# Patient Record
Sex: Male | Born: 1975 | Race: Black or African American | Hispanic: No | Marital: Single | Smoking: Former smoker
Health system: Southern US, Community
[De-identification: ages and names within clinical notes are randomized; demographics above are authoritative.]

## PROBLEM LIST (undated history)

## (undated) DIAGNOSIS — F0781 Postconcussional syndrome: Secondary | ICD-10-CM

## (undated) DIAGNOSIS — J45909 Unspecified asthma, uncomplicated: Secondary | ICD-10-CM

## (undated) DIAGNOSIS — K219 Gastro-esophageal reflux disease without esophagitis: Secondary | ICD-10-CM

## (undated) DIAGNOSIS — E785 Hyperlipidemia, unspecified: Secondary | ICD-10-CM

## (undated) HISTORY — DX: Hyperlipidemia, unspecified: E78.5

## (undated) HISTORY — PX: OTHER SURGICAL HISTORY: SHX169

## (undated) HISTORY — DX: Postconcussional syndrome: F07.81

---

## 1997-08-04 ENCOUNTER — Emergency Department (HOSPITAL_COMMUNITY): Admission: EM | Admit: 1997-08-04 | Discharge: 1997-08-04 | Payer: Self-pay | Admitting: *Deleted

## 1998-06-28 ENCOUNTER — Emergency Department (HOSPITAL_COMMUNITY): Admission: EM | Admit: 1998-06-28 | Discharge: 1998-06-28 | Payer: Self-pay | Admitting: Emergency Medicine

## 1998-08-20 ENCOUNTER — Emergency Department (HOSPITAL_COMMUNITY): Admission: EM | Admit: 1998-08-20 | Discharge: 1998-08-20 | Payer: Self-pay | Admitting: Emergency Medicine

## 1998-08-22 ENCOUNTER — Emergency Department (HOSPITAL_COMMUNITY): Admission: EM | Admit: 1998-08-22 | Discharge: 1998-08-22 | Payer: Self-pay | Admitting: Emergency Medicine

## 1998-08-24 ENCOUNTER — Emergency Department (HOSPITAL_COMMUNITY): Admission: EM | Admit: 1998-08-24 | Discharge: 1998-08-24 | Payer: Self-pay | Admitting: Emergency Medicine

## 1999-03-24 ENCOUNTER — Emergency Department (HOSPITAL_COMMUNITY): Admission: EM | Admit: 1999-03-24 | Discharge: 1999-03-24 | Payer: Self-pay | Admitting: Emergency Medicine

## 1999-03-24 ENCOUNTER — Encounter: Payer: Self-pay | Admitting: Family Medicine

## 2001-08-25 ENCOUNTER — Emergency Department (HOSPITAL_COMMUNITY): Admission: EM | Admit: 2001-08-25 | Discharge: 2001-08-25 | Payer: Self-pay | Admitting: Emergency Medicine

## 2004-09-19 ENCOUNTER — Emergency Department (HOSPITAL_COMMUNITY): Admission: EM | Admit: 2004-09-19 | Discharge: 2004-09-19 | Payer: Self-pay | Admitting: Emergency Medicine

## 2005-06-03 ENCOUNTER — Emergency Department (HOSPITAL_COMMUNITY): Admission: EM | Admit: 2005-06-03 | Discharge: 2005-06-03 | Payer: Self-pay | Admitting: Emergency Medicine

## 2006-06-25 ENCOUNTER — Emergency Department (HOSPITAL_COMMUNITY): Admission: EM | Admit: 2006-06-25 | Discharge: 2006-06-26 | Payer: Self-pay | Admitting: Emergency Medicine

## 2006-06-25 ENCOUNTER — Emergency Department (HOSPITAL_COMMUNITY): Admission: EM | Admit: 2006-06-25 | Discharge: 2006-06-25 | Payer: Self-pay | Admitting: Emergency Medicine

## 2006-07-04 ENCOUNTER — Emergency Department (HOSPITAL_COMMUNITY): Admission: EM | Admit: 2006-07-04 | Discharge: 2006-07-04 | Payer: Self-pay | Admitting: Family Medicine

## 2007-03-06 ENCOUNTER — Emergency Department (HOSPITAL_COMMUNITY): Admission: EM | Admit: 2007-03-06 | Discharge: 2007-03-07 | Payer: Self-pay | Admitting: Emergency Medicine

## 2007-06-21 ENCOUNTER — Emergency Department (HOSPITAL_COMMUNITY): Admission: EM | Admit: 2007-06-21 | Discharge: 2007-06-21 | Payer: Self-pay | Admitting: Emergency Medicine

## 2007-11-01 ENCOUNTER — Emergency Department (HOSPITAL_COMMUNITY): Admission: EM | Admit: 2007-11-01 | Discharge: 2007-11-02 | Payer: Self-pay | Admitting: Emergency Medicine

## 2008-10-19 ENCOUNTER — Emergency Department (HOSPITAL_COMMUNITY): Admission: EM | Admit: 2008-10-19 | Discharge: 2008-10-19 | Payer: Self-pay | Admitting: Family Medicine

## 2010-07-22 LAB — POCT RAPID STREP A (OFFICE): Streptococcus, Group A Screen (Direct): POSITIVE — AB

## 2011-01-07 LAB — URINALYSIS, ROUTINE W REFLEX MICROSCOPIC
Glucose, UA: NEGATIVE
Hgb urine dipstick: NEGATIVE
Ketones, ur: NEGATIVE
Protein, ur: NEGATIVE

## 2011-01-07 LAB — STREP A DNA PROBE

## 2011-01-11 LAB — CBC
Hemoglobin: 14.6
Platelets: 188
RDW: 14.2

## 2011-01-11 LAB — URINE MICROSCOPIC-ADD ON

## 2011-01-11 LAB — RAPID STREP SCREEN (MED CTR MEBANE ONLY): Streptococcus, Group A Screen (Direct): POSITIVE — AB

## 2011-01-11 LAB — POCT I-STAT, CHEM 8
BUN: 10
Chloride: 102
Potassium: 3.5
Sodium: 135

## 2011-01-11 LAB — DIFFERENTIAL
Basophils Absolute: 0
Lymphocytes Relative: 18
Monocytes Absolute: 1.5 — ABNORMAL HIGH
Neutro Abs: 12.6 — ABNORMAL HIGH
Neutrophils Relative %: 73

## 2011-01-11 LAB — URINALYSIS, ROUTINE W REFLEX MICROSCOPIC
Glucose, UA: NEGATIVE
Ketones, ur: NEGATIVE
Leukocytes, UA: NEGATIVE
pH: 8.5 — ABNORMAL HIGH

## 2011-10-22 ENCOUNTER — Emergency Department (HOSPITAL_COMMUNITY)
Admission: EM | Admit: 2011-10-22 | Discharge: 2011-10-22 | Disposition: A | Discharge status: Home / Self Care | Payer: Medicaid Other | Source: Home / Self Care | Attending: Family Medicine | Admitting: Family Medicine

## 2011-10-22 ENCOUNTER — Encounter (HOSPITAL_COMMUNITY): Payer: Self-pay | Admitting: *Deleted

## 2011-10-22 DIAGNOSIS — J020 Streptococcal pharyngitis: Secondary | ICD-10-CM

## 2011-10-22 HISTORY — DX: Unspecified asthma, uncomplicated: J45.909

## 2011-10-22 LAB — POCT RAPID STREP A: Streptococcus, Group A Screen (Direct): POSITIVE — AB

## 2011-10-22 MED ORDER — CEFTRIAXONE SODIUM 1 G IJ SOLR
1.0000 g | Freq: Once | INTRAMUSCULAR | Status: AC
  Administered 2011-10-22: 1 g via INTRAMUSCULAR

## 2011-10-22 MED ORDER — GI COCKTAIL ~~LOC~~
30.0000 mL | Freq: Once | ORAL | Status: AC
  Administered 2011-10-22: 30 mL via ORAL

## 2011-10-22 MED ORDER — GI COCKTAIL ~~LOC~~
ORAL | Status: AC
  Filled 2011-10-22: qty 30

## 2011-10-22 MED ORDER — CEFTRIAXONE SODIUM 1 G IJ SOLR
INTRAMUSCULAR | Status: AC
  Filled 2011-10-22: qty 10

## 2011-10-22 MED ORDER — AMOXICILLIN-POT CLAVULANATE 875-125 MG PO TABS: 1.0000 | ORAL_TABLET | Freq: Two times a day (BID) | ORAL | Status: AC

## 2011-10-22 NOTE — ED Notes (Signed)
Pt is oriented x3 and is speaking full sentences without SOB. Pt states his sore started Saturday and the chest pain started Monday on the upper Lt side chest and it comes and goes every three mins sharp pain, it does not radiate any where. Has never felt this pain before. Pt states he has not lift or had a cough. RN Harriett Sine informed of Pt and I advise Pt if anything changes to inform the front desk.

## 2011-10-22 NOTE — ED Notes (Signed)
Pt  Reports  About  3    Days  Of   sorethroat  Pain on  Swallowing  As  Well  As  l  Sided  Chest  Pain    X  3  Days        - pt  Has   Red    Throat  With  Exudate  Present           Pt  Is  Sitting     Upright on exam table  Speaking    In  Complete        sentances

## 2012-08-25 ENCOUNTER — Emergency Department (HOSPITAL_COMMUNITY): Payer: Medicaid Other

## 2012-08-25 ENCOUNTER — Emergency Department (HOSPITAL_COMMUNITY)
Admission: EM | Admit: 2012-08-25 | Discharge: 2012-08-26 | Disposition: A | Discharge status: Home / Self Care | Payer: Medicaid Other | Attending: Emergency Medicine | Admitting: Emergency Medicine

## 2012-08-25 ENCOUNTER — Encounter (HOSPITAL_COMMUNITY): Payer: Self-pay | Admitting: Emergency Medicine

## 2012-08-25 DIAGNOSIS — J45909 Unspecified asthma, uncomplicated: Secondary | ICD-10-CM | POA: Insufficient documentation

## 2012-08-25 DIAGNOSIS — I493 Ventricular premature depolarization: Secondary | ICD-10-CM

## 2012-08-25 DIAGNOSIS — K219 Gastro-esophageal reflux disease without esophagitis: Secondary | ICD-10-CM | POA: Insufficient documentation

## 2012-08-25 DIAGNOSIS — Z880 Allergy status to penicillin: Secondary | ICD-10-CM | POA: Insufficient documentation

## 2012-08-25 DIAGNOSIS — Z79899 Other long term (current) drug therapy: Secondary | ICD-10-CM | POA: Insufficient documentation

## 2012-08-25 DIAGNOSIS — I4949 Other premature depolarization: Secondary | ICD-10-CM | POA: Insufficient documentation

## 2012-08-25 MED ORDER — GI COCKTAIL ~~LOC~~
30.0000 mL | Freq: Once | ORAL | Status: AC
  Administered 2012-08-26: 30 mL via ORAL
  Filled 2012-08-25: qty 30

## 2012-08-25 MED ORDER — NITROGLYCERIN 0.4 MG SL SUBL
0.4000 mg | SUBLINGUAL_TABLET | SUBLINGUAL | Status: DC | PRN
  Filled 2012-08-25: qty 25

## 2012-08-25 MED ORDER — PANTOPRAZOLE SODIUM 40 MG IV SOLR: 40.0000 mg | Freq: Once | INTRAVENOUS | Status: DC

## 2012-08-25 MED ORDER — ASPIRIN 325 MG PO TABS
325.0000 mg | ORAL_TABLET | ORAL | Status: AC
  Administered 2012-08-26: 325 mg via ORAL
  Filled 2012-08-25: qty 1

## 2012-08-25 NOTE — ED Notes (Signed)
PT. REPORTS INTERMITTENT LEFT CHEST PAIN FOR 4 DAYS , DENIES SOB , NAUSEA OR DIAPHORESIS.

## 2012-08-26 LAB — CBC
HCT: 42.1 % (ref 39.0–52.0)
Hemoglobin: 14.7 g/dL (ref 13.0–17.0)
MCH: 29.3 pg (ref 26.0–34.0)
MCHC: 34.9 g/dL (ref 30.0–36.0)
MCV: 84 fL (ref 78.0–100.0)
RBC: 5.01 MIL/uL (ref 4.22–5.81)

## 2012-08-26 LAB — BASIC METABOLIC PANEL
BUN: 8 mg/dL (ref 6–23)
CO2: 28 mEq/L (ref 19–32)
Calcium: 9.9 mg/dL (ref 8.4–10.5)
Creatinine, Ser: 1.11 mg/dL (ref 0.50–1.35)
GFR calc non Af Amer: 85 mL/min — ABNORMAL LOW (ref >90)
Glucose, Bld: 124 mg/dL — ABNORMAL HIGH (ref 70–99)

## 2012-08-26 LAB — POCT I-STAT TROPONIN I: Troponin i, poc: 0 ng/mL (ref 0.00–0.08)

## 2012-08-26 MED ORDER — SUCRALFATE 1 G PO TABS: 1.0000 g | ORAL_TABLET | Freq: Four times a day (QID) | ORAL | Status: DC

## 2012-08-26 MED ORDER — PANTOPRAZOLE SODIUM 40 MG PO TBEC
40.0000 mg | DELAYED_RELEASE_TABLET | Freq: Once | ORAL | Status: AC
  Administered 2012-08-26: 40 mg via ORAL
  Filled 2012-08-26: qty 1

## 2012-08-26 MED ORDER — PANTOPRAZOLE SODIUM 40 MG PO TBEC: 40.0000 mg | DELAYED_RELEASE_TABLET | Freq: Every day | ORAL | Status: DC

## 2012-08-26 NOTE — ED Provider Notes (Signed)
History     CSN: [id number]  Arrival date & time 08/25/12  2308   First MD Initiated Contact with Patient 08/25/12 2332      Chief Complaint  Patient presents with  . Chest Pain    (Consider location/radiation/quality/duration/timing/severity/associated sxs/prior treatment) HPI 37 year old male presents from home with complaint of left sided chest pain off and on for the last 4 days.  He notices the pain is worse after eating.  He's been taking TUMS which has helped his symptoms until tonight.  Patient denies any medical problems , aside from asthma.  No family history of coronary disease, diabetes or hypertension.  No early cardiac death.  No shortness of breath, no nausea no vomiting.  No abdominal pain.  No fever no cough.  Pain is worse with stretching.  He denies any tenderness to this chest wall.  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  He denies feeling palpitations.  No prior history of same. Past Medical History  Diagnosis Date  . Asthma     History reviewed. No pertinent past surgical history.  No family history on file.  History  Substance Use Topics  . Smoking status: Never Smoker   . Smokeless tobacco: Not on file  . Alcohol Use: Yes      Review of Systems  See History of Present Illness; otherwise all other systems are reviewed and negative  Allergies  Penicillins  Home Medications   Current Outpatient Rx  Name  Route  Sig  Dispense  Refill  . clindamycin (CLEOCIN) 300 MG capsule   Oral   Take 300 mg by mouth 3 (three) times daily.         Marland Kitchen HYDROcodone-acetaminophen (NORCO) 10-325 MG per tablet   Oral   Take 1 tablet by mouth every 6 (six) hours as needed.           BP 143/53  Pulse 45  Temp(Src) 98.3 F (36.8 C) (Oral)  Resp 16  SpO2 100%  Physical Exam  Nursing note and vitals reviewed. Constitutional: He is oriented to person, place, and time. He appears well-developed and well-nourished.  HENT:  Head: Normocephalic and atraumatic.   Nose: Nose normal.  Mouth/Throat: Oropharynx is clear and moist.  Eyes: Conjunctivae and EOM are normal. Pupils are equal, round, and reactive to light.  Neck: Normal range of motion. Neck supple. No JVD present. No tracheal deviation present. No thyromegaly present.  Cardiovascular: Normal rate, normal heart sounds and intact distal pulses.  Exam reveals no gallop and no friction rub.   No murmur heard. Frequent extra systolic beats auscultated  Pulmonary/Chest: Effort normal and breath sounds normal. No stridor. No respiratory distress. He has no wheezes. He has no rales. He exhibits no tenderness.  Abdominal: Soft. Bowel sounds are normal. He exhibits no distension and no mass. There is no tenderness. There is no rebound and no guarding.  Musculoskeletal: Normal range of motion. He exhibits no edema and no tenderness.  Lymphadenopathy:    He has no cervical adenopathy.  Neurological: He is alert and oriented to person, place, and time. He exhibits normal muscle tone. Coordination normal.  Skin: Skin is warm and dry. No rash noted. No erythema. No pallor.  Psychiatric: He has a normal mood and affect. His behavior is normal. Judgment and thought content normal.    ED Course  Procedures (including critical care time)  Labs Reviewed  BASIC METABOLIC PANEL - Abnormal; Notable for the following:    Glucose, Bld 124 (*)  GFR calc non Af Amer 85 (*)    All other components within normal limits  CBC  MAGNESIUM  POCT I-STAT TROPONIN I   Dg Chest 2 View  08/25/2012  *RADIOLOGY REPORT*  Clinical Data: Chest pain.  CHEST - 2 VIEW  Comparison: 11/01/2007  Findings: Heart and mediastinal contours are within normal limits. No focal opacities or effusions.  No acute bony abnormality.  IMPRESSION: No active cardiopulmonary disease.   Original Report Authenticated By: Charlett Nose, M.D.     Date: 08/25/2012  Rate: 85  Rhythm: normal sinus rhythm and premature ventricular contractions (PVC)   QRS Axis: normal  Intervals: normal  ST/T Wave abnormalities: normal  Conduction Disutrbances:none  Narrative Interpretation: bigeminy  Old EKG Reviewed: none available    1. GERD (gastroesophageal reflux disease)   2. Frequent PVCs       MDM  37 year old male, appears nontoxic.  Symptoms seem consistent with GERD, esophageal spasm.  Labs are pending.  EKG shows frequent PVCs in a pattern of bigeminy.  May be due to a loculated abnormality.  Will treat with GI cocktail and Protonix and continue to monitor.       Olivia Mackie, MD 08/26/12 [phone]

## 2014-08-30 ENCOUNTER — Emergency Department (HOSPITAL_COMMUNITY)
Admission: EM | Admit: 2014-08-30 | Discharge: 2014-08-30 | Disposition: A | Discharge status: Home / Self Care | Payer: Medicaid Other | Source: Home / Self Care | Attending: Family Medicine | Admitting: Family Medicine

## 2014-08-30 ENCOUNTER — Encounter (HOSPITAL_COMMUNITY): Payer: Self-pay | Admitting: Emergency Medicine

## 2014-08-30 DIAGNOSIS — B349 Viral infection, unspecified: Secondary | ICD-10-CM

## 2014-08-30 LAB — POCT RAPID STREP A: Streptococcus, Group A Screen (Direct): NEGATIVE

## 2014-08-30 MED ORDER — IBUPROFEN 800 MG PO TABS
800.0000 mg | ORAL_TABLET | Freq: Once | ORAL | Status: AC
  Administered 2014-08-30: 800 mg via ORAL

## 2014-08-30 MED ORDER — ALBUTEROL SULFATE HFA 108 (90 BASE) MCG/ACT IN AERS: 1.0000 | INHALATION_SPRAY | Freq: Four times a day (QID) | RESPIRATORY_TRACT | Status: DC | PRN

## 2014-08-30 MED ORDER — IBUPROFEN 800 MG PO TABS
ORAL_TABLET | ORAL | Status: AC
  Filled 2014-08-30: qty 1

## 2014-08-30 NOTE — Discharge Instructions (Signed)
i suspect you are suffering from the same viral illness that your family members are experiencing. Your exam and vital signs are very reassuring. Your rapid strep test is negative. Please use inhaler as prescribed for wheezing or persistent cough. Plenty of fluids, tylenol and rest as you need to. If symptoms become suddenly worse or severe, please seek re-evaluation at your nearest emergency room. If symptoms do not improve over the next several days, please follow up with your doctor at the family practice center.  Cough, Adult  A cough is a reflex that helps clear your throat and airways. It can help heal the body or may be a reaction to an irritated airway. A cough may only last 2 or 3 weeks (acute) or may last more than 8 weeks (chronic).  CAUSES Acute cough:  Viral or bacterial infections. Chronic cough:  Infections.  Allergies.  Asthma.  Post-nasal drip.  Smoking.  Heartburn or acid reflux.  Some medicines.  Chronic lung problems (COPD).  Cancer. SYMPTOMS   Cough.  Fever.  Chest pain.  Increased breathing rate.  High-pitched whistling sound when breathing (wheezing).  Colored mucus that you cough up (sputum). TREATMENT   A bacterial cough may be treated with antibiotic medicine.  A viral cough must run its course and will not respond to antibiotics.  Your caregiver may recommend other treatments if you have a chronic cough. HOME CARE INSTRUCTIONS   Only take over-the-counter or prescription medicines for pain, discomfort, or fever as directed by your caregiver. Use cough suppressants only as directed by your caregiver.  Use a cold steam vaporizer or humidifier in your bedroom or home to help loosen secretions.  Sleep in a semi-upright position if your cough is worse at night.  Rest as needed.  Stop smoking if you smoke. SEEK IMMEDIATE MEDICAL CARE IF:   You have pus in your sputum.  Your cough starts to worsen.  You cannot control your cough with  suppressants and are losing sleep.  You begin coughing up blood.  You have difficulty breathing.  You develop pain which is getting worse or is uncontrolled with medicine.  You have a fever. MAKE SURE YOU:   Understand these instructions.  Will watch your condition.  Will get help right away if you are not doing well or get worse. Document Released: 09/28/2010 Document Revised: 06/24/2011 Document Reviewed: 09/28/2010 [facility] Patient Information 2015 [employer], [place]. This information is not intended to replace advice given to you by your health care provider. Make sure you discuss any questions you have with your health care provider.  Viral Infections A viral infection can be caused by different types of viruses.Most viral infections are not serious and resolve on their own. However, some infections may cause severe symptoms and may lead to further complications. SYMPTOMS Viruses can frequently cause:  Minor sore throat.  Aches and pains.  Headaches.  Runny nose.  Different types of rashes.  Watery eyes.  Tiredness.  Cough.  Loss of appetite.  Gastrointestinal infections, resulting in nausea, vomiting, and diarrhea. These symptoms do not respond to antibiotics because the infection is not caused by bacteria. However, you might catch a bacterial infection following the viral infection. This is sometimes called a "superinfection." Symptoms of such a bacterial infection may include:  Worsening sore throat with pus and difficulty swallowing.  Swollen neck glands.  Chills and a high or persistent fever.  Severe headache.  Tenderness over the sinuses.  Persistent overall ill feeling (malaise), muscle aches, and  tiredness (fatigue).  Persistent cough.  Yellow, green, or brown mucus production with coughing. HOME CARE INSTRUCTIONS   Only take over-the-counter or prescription medicines for pain, discomfort, diarrhea, or fever as directed by your  caregiver.  Drink enough water and fluids to keep your urine clear or pale yellow. Sports drinks can provide valuable electrolytes, sugars, and hydration.  Get plenty of rest and maintain proper nutrition. Soups and broths with crackers or rice are fine. SEEK IMMEDIATE MEDICAL CARE IF:   You have severe headaches, shortness of breath, chest pain, neck pain, or an unusual rash.  You have uncontrolled vomiting, diarrhea, or you are unable to keep down fluids.  You or your child has an oral temperature above 102 F (38.9 C), not controlled by medicine.  Your baby is older than 3 months with a rectal temperature of 102 F (38.9 C) or higher.  Your baby is 43 months old or younger with a rectal temperature of 100.4 F (38 C) or higher. MAKE SURE YOU:   Understand these instructions.  Will watch your condition.  Will get help right away if you are not doing well or get worse. Document Released: 01/09/2005 Document Revised: 06/24/2011 Document Reviewed: 08/06/2010 [facility]Exit[facility] Patient Information 2015 [employer], [place]. This information is not intended to replace advice given to you by your health care provider. Make sure you discuss any questions you have with your health care provider.

## 2014-08-30 NOTE — ED Provider Notes (Signed)
CSN: [id number]     Arrival date & time 08/30/14  [id number] History   First MD Initiated Contact with Tom Miranda 08/30/14 [phone]     Chief Complaint  Tom Miranda presents with  . URI  . Abdominal Pain   (Consider location/radiation/quality/duration/timing/severity/associated sxs/prior Treatment) HPI Comments: PCP: [facility] Nonsmoker Owns tattoo shop Also mentions an upset stomach and that he is passing quite a bit of gas.   Tom Miranda is a 39 y.o. male presenting with URI and abdominal pain. The history is provided by the Tom Miranda.  URI Presenting symptoms: congestion, cough, rhinorrhea and sore throat   Presenting symptoms: no ear pain, no facial pain, no fatigue and no fever   Severity:  Mild Onset quality:  Gradual Duration: 12-18 hours. Timing:  Constant Progression:  Unchanged Chronicity:  New Risk factors: sick contacts   Risk factors comment:  Tom RainwaterFiancee and Tom Miranda ill with same Abdominal Pain Associated symptoms: cough, shortness of breath and sore throat   Associated symptoms: no constipation, no diarrhea, no fatigue, no fever, no nausea and no vomiting     Past Medical History  Diagnosis Date  . Asthma    History reviewed. No pertinent past surgical history. No family history on file. History  Substance Use Topics  . Smoking status: Never Smoker   . Smokeless tobacco: Not on file  . Alcohol Use: Yes    Review of Systems  Constitutional: Negative for fever and fatigue.  HENT: Positive for congestion, rhinorrhea and sore throat. Negative for ear pain.   Eyes: Negative.   Respiratory: Positive for cough and shortness of breath.   Cardiovascular: Negative.   Gastrointestinal: Positive for abdominal pain. Negative for nausea, vomiting, diarrhea and constipation.  Genitourinary: Negative.   Musculoskeletal: Negative.   Skin: Negative.     Allergies  Penicillins  Home Medications   Prior to Admission medications   Medication Sig Start Date End Date Taking? Authorizing Provider   albuterol (PROVENTIL HFA;VENTOLIN HFA) 108 (90 BASE) MCG/ACT inhaler Inhale 1-2 puffs into the lungs every 6 (six) hours as needed for wheezing or shortness of breath. Or persistent cough 08/30/14   Ria ClockJennifer Lee H [place] [employer], PA  calcium carbonate (TUMS - DOSED IN MG ELEMENTAL CALCIUM) 500 MG chewable tablet Chew 1 tablet by mouth daily.    Historical Provider, MD  pantoprazole (PROTONIX) 40 MG tablet Take 1 tablet (40 mg total) by mouth daily. 08/26/12   Marisa Severinlga Otter, MD  sucralfate (CARAFATE) 1 G tablet Take 1 tablet (1 g total) by mouth 4 (four) times daily. 08/26/12   Marisa Severinlga Otter, MD   BP 118/70 mmHg  Pulse 44  Temp(Src) 98.3 F (36.8 C) (Oral)  Resp 16  SpO2 98% Physical Exam  Constitutional: He is oriented to person, place, and time. He appears well-developed and well-nourished. No distress.  HENT:  Head: Normocephalic and atraumatic.  Right Ear: Hearing, tympanic membrane, external ear and ear canal normal.  Left Ear: Hearing, tympanic membrane, external ear and ear canal normal.  Nose: Nose normal.  Mouth/Throat: Uvula is midline, oropharynx is clear and moist and mucous membranes are normal.  Eyes: Conjunctivae are normal.  Neck: Normal range of motion. Neck supple.  Cardiovascular: Regular rhythm, normal heart sounds and normal pulses.  Bradycardia present.   Pulmonary/Chest: Effort normal and breath sounds normal. No respiratory distress. He has no decreased breath sounds. He has no wheezes. He has no rales. He exhibits no tenderness.  Speaks in full, clear sentences. Does not appear to have any increased work  of breathing  Abdominal: Soft. Bowel sounds are normal. He exhibits no distension and no mass. There is no tenderness. There is no rebound and no guarding.  Musculoskeletal: Normal range of motion.  Lymphadenopathy:    He has no cervical adenopathy.  Neurological: He is alert and oriented to person, place, and time.  Skin: Skin is warm and dry.  Psychiatric: He has a normal  mood and affect. His behavior is normal.  Nursing note and vitals reviewed.   ED Course  Procedures (including critical care time) Labs Review Labs Reviewed  POCT RAPID STREP A ([facility] CARE ONLY)    Imaging Review No results found.   MDM   1. Viral syndrome    Rapid strep is negative I suspect Tom Miranda is dealing with same viral syndrome that his family members are experiencing.  Exam is reassuring and vitals are also without concern. Tom Miranda is a very physically fit individual which is likely the reason for his resting bradycardia.  Will treat with Albuterol MDI should he feel as if he is wheezing, salt water gargles, tylenol, fluids and rest. Advised to follow up with his PCP if symptoms worse or do not improve.     Ria ClockJennifer Lee H Aastha Dayley, [place]PA 08/30/14 1028

## 2014-08-30 NOTE — ED Notes (Signed)
C/o prod cough, ST, and abd pain onset yest night Denies fevers, chills, diarrhea Alert, no signs of acute

## 2014-09-03 LAB — CULTURE, GROUP A STREP

## 2015-01-09 ENCOUNTER — Emergency Department (HOSPITAL_COMMUNITY)
Admission: EM | Admit: 2015-01-09 | Discharge: 2015-01-09 | Disposition: A | Discharge status: Home / Self Care | Payer: Medicaid Other | Attending: Emergency Medicine | Admitting: Emergency Medicine

## 2015-01-09 ENCOUNTER — Encounter (HOSPITAL_COMMUNITY): Payer: Self-pay | Admitting: Emergency Medicine

## 2015-01-09 ENCOUNTER — Emergency Department (HOSPITAL_COMMUNITY): Payer: Medicaid Other

## 2015-01-09 DIAGNOSIS — R079 Chest pain, unspecified: Secondary | ICD-10-CM

## 2015-01-09 DIAGNOSIS — J45909 Unspecified asthma, uncomplicated: Secondary | ICD-10-CM | POA: Insufficient documentation

## 2015-01-09 DIAGNOSIS — Z880 Allergy status to penicillin: Secondary | ICD-10-CM | POA: Diagnosis not present

## 2015-01-09 DIAGNOSIS — Z8719 Personal history of other diseases of the digestive system: Secondary | ICD-10-CM | POA: Diagnosis not present

## 2015-01-09 HISTORY — DX: Gastro-esophageal reflux disease without esophagitis: K21.9

## 2015-01-09 LAB — I-STAT TROPONIN, ED
TROPONIN I, POC: 0.01 ng/mL (ref 0.00–0.08)
Troponin i, poc: 0 ng/mL (ref 0.00–0.08)

## 2015-01-09 LAB — BASIC METABOLIC PANEL
ANION GAP: 8 (ref 5–15)
BUN: 9 mg/dL (ref 6–20)
CO2: 25 mmol/L (ref 22–32)
Calcium: 9.6 mg/dL (ref 8.9–10.3)
Chloride: 105 mmol/L (ref 101–111)
Creatinine, Ser: 1.12 mg/dL (ref 0.61–1.24)
GFR calc Af Amer: >60 mL/min (ref >60)
Glucose, Bld: 110 mg/dL — ABNORMAL HIGH (ref 65–99)
POTASSIUM: 4.2 mmol/L (ref 3.5–5.1)
SODIUM: 138 mmol/L (ref 135–145)

## 2015-01-09 LAB — CBC
HEMATOCRIT: 44.2 % (ref 39.0–52.0)
Hemoglobin: 15 g/dL (ref 13.0–17.0)
MCH: 29.2 pg (ref 26.0–34.0)
MCHC: 33.9 g/dL (ref 30.0–36.0)
MCV: 86 fL (ref 78.0–100.0)
PLATELETS: 205 10*3/uL (ref 150–400)
RBC: 5.14 MIL/uL (ref 4.22–5.81)
RDW: 13.6 % (ref 11.5–15.5)
WBC: 5.4 10*3/uL (ref 4.0–10.5)

## 2015-01-09 LAB — MAGNESIUM: MAGNESIUM: 1.8 mg/dL (ref 1.7–2.4)

## 2015-01-09 MED ORDER — PANTOPRAZOLE SODIUM 40 MG PO TBEC
40.0000 mg | DELAYED_RELEASE_TABLET | Freq: Once | ORAL | Status: AC
  Administered 2015-01-09: 40 mg via ORAL
  Filled 2015-01-09: qty 1

## 2015-01-09 MED ORDER — ASPIRIN 81 MG PO CHEW
324.0000 mg | CHEWABLE_TABLET | Freq: Once | ORAL | Status: AC
  Administered 2015-01-09: 324 mg via ORAL
  Filled 2015-01-09: qty 4

## 2015-01-09 NOTE — Discharge Instructions (Signed)
Your caregiver has diagnosed you as having chest pain that is not specific for one problem, but does not require admission.  You are at low risk for an acute heart condition or other serious illness. Chest pain comes from many different causes.  SEEK IMMEDIATE MEDICAL ATTENTION IF: You have severe chest pain, especially if the pain is crushing or pressure-like and spreads to the arms, back, neck, or jaw, or if you have sweating, nausea (feeling sick to your stomach), or shortness of breath. THIS IS AN EMERGENCY. Don't wait to see if the pain will go away. Get medical help at once. Call 911 or 0 (operator). DO NOT drive yourself to the hospital.  Your chest pain gets worse and does not go away with rest.  You have an attack of chest pain lasting longer than usual, despite rest and treatment with the medications your caregiver has prescribed.  You wake from sleep with chest pain or shortness of breath.  You feel dizzy or faint.  You have chest pain not typical of your usual pain for which you originally saw your caregiver.   Chest Pain (Nonspecific) It is often hard to give a specific diagnosis for the cause of chest pain. There is always a chance that your pain could be related to something serious, such as a heart attack or a blood clot in the lungs. You need to follow up with your health care provider for further evaluation. CAUSES   Heartburn.  Pneumonia or bronchitis.  Anxiety or stress.  Inflammation around your heart (pericarditis) or lung (pleuritis or pleurisy).  A blood clot in the lung.  A collapsed lung (pneumothorax). It can develop suddenly on its own (spontaneous pneumothorax) or from trauma to the chest.  Shingles infection (herpes zoster virus). The chest wall is composed of bones, muscles, and cartilage. Any of these can be the source of the pain.  The bones can be bruised by injury.  The muscles or cartilage can be strained by coughing or overwork.  The cartilage can  be affected by inflammation and become sore (costochondritis). DIAGNOSIS  Lab tests or other studies may be needed to find the cause of your pain. Your health care provider may have you take a test called an ambulatory electrocardiogram (ECG). An ECG records your heartbeat patterns over a 24-hour period. You may also have other tests, such as:  Transthoracic echocardiogram (TTE). During echocardiography, sound waves are used to evaluate how blood flows through your heart.  Transesophageal echocardiogram (TEE).  Cardiac monitoring. This allows your health care provider to monitor your heart rate and rhythm in real time.  Holter monitor. This is a portable device that records your heartbeat and can help diagnose heart arrhythmias. It allows your health care provider to track your heart activity for several days, if needed.  Stress tests by exercise or by giving medicine that makes the heart beat faster. TREATMENT   Treatment depends on what may be causing your chest pain. Treatment may include:  Acid blockers for heartburn.  Anti-inflammatory medicine.  Pain medicine for inflammatory conditions.  Antibiotics if an infection is present.  You may be advised to change lifestyle habits. This includes stopping smoking and avoiding alcohol, caffeine, and chocolate.  You may be advised to keep your head raised (elevated) when sleeping. This reduces the chance of acid going backward from your stomach into your esophagus. Most of the time, nonspecific chest pain will improve within 2-3 days with rest and mild pain medicine.  HOME  CARE INSTRUCTIONS   If antibiotics were prescribed, take them as directed. Finish them even if you start to feel better.  For the next few days, avoid physical activities that bring on chest pain. Continue physical activities as directed.  Do not use any tobacco products, including cigarettes, chewing tobacco, or electronic cigarettes.  Avoid drinking  alcohol.  Only take medicine as directed by your health care provider.  Follow your health care provider's suggestions for further testing if your chest pain does not go away.  Keep any follow-up appointments you made. If you do not go to an appointment, you could develop lasting (chronic) problems with pain. If there is any problem keeping an appointment, call to reschedule. SEEK MEDICAL CARE IF:   Your chest pain does not go away, even after treatment.  You have a rash with blisters on your chest.  You have a fever. SEEK IMMEDIATE MEDICAL CARE IF:   You have increased chest pain or pain that spreads to your arm, neck, jaw, back, or abdomen.  You have shortness of breath.  You have an increasing cough, or you cough up blood.  You have severe back or abdominal pain.  You feel nauseous or vomit.  You have severe weakness.  You faint.  You have chills. This is an emergency. Do not wait to see if the pain will go away. Get medical help at once. Call your local emergency services (911 in [place] Do not drive yourself to the hospital. MAKE SURE YOU:   Understand these instructions.  Will watch your condition.  Will get help right away if you are not doing well or get worse. Document Released: 01/09/2005 Document Revised: 04/06/2013 Document Reviewed: 11/05/2007 [facility] Patient Information 2015 [place], [place]. This information is not intended to replace advice given to you by your health care provider. Make sure you discuss any questions you have with your health care provider.  Chest Pain (Nonspecific) It is often hard to give a specific diagnosis for the cause of chest pain. There is always a chance that your pain could be related to something serious, such as a heart attack or a blood clot in the lungs. You need to follow up with your health care provider for further evaluation. CAUSES   Heartburn.  Pneumonia or bronchitis.  Anxiety or stress.  Inflammation around  your heart (pericarditis) or lung (pleuritis or pleurisy).  A blood clot in the lung.  A collapsed lung (pneumothorax). It can develop suddenly on its own (spontaneous pneumothorax) or from trauma to the chest.  Shingles infection (herpes zoster virus). The chest wall is composed of bones, muscles, and cartilage. Any of these can be the source of the pain.  The bones can be bruised by injury.  The muscles or cartilage can be strained by coughing or overwork.  The cartilage can be affected by inflammation and become sore (costochondritis). DIAGNOSIS  Lab tests or other studies may be needed to find the cause of your pain. Your health care provider may have you take a test called an ambulatory electrocardiogram (ECG). An ECG records your heartbeat patterns over a 24-hour period. You may also have other tests, such as:  Transthoracic echocardiogram (TTE). During echocardiography, sound waves are used to evaluate how blood flows through your heart.  Transesophageal echocardiogram (TEE).  Cardiac monitoring. This allows your health care provider to monitor your heart rate and rhythm in real time.  Holter monitor. This is a portable device that records your heartbeat and can help  diagnose heart arrhythmias. It allows your health care provider to track your heart activity for several days, if needed.  Stress tests by exercise or by giving medicine that makes the heart beat faster. TREATMENT   Treatment depends on what may be causing your chest pain. Treatment may include:  Acid blockers for heartburn.  Anti-inflammatory medicine.  Pain medicine for inflammatory conditions.  Antibiotics if an infection is present.  You may be advised to change lifestyle habits. This includes stopping smoking and avoiding alcohol, caffeine, and chocolate.  You may be advised to keep your head raised (elevated) when sleeping. This reduces the chance of acid going backward from your stomach into your  esophagus. Most of the time, nonspecific chest pain will improve within 2-3 days with rest and mild pain medicine.  HOME CARE INSTRUCTIONS   If antibiotics were prescribed, take them as directed. Finish them even if you start to feel better.  For the next few days, avoid physical activities that bring on chest pain. Continue physical activities as directed.  Do not use any tobacco products, including cigarettes, chewing tobacco, or electronic cigarettes.  Avoid drinking alcohol.  Only take medicine as directed by your health care provider.  Follow your health care provider's suggestions for further testing if your chest pain does not go away.  Keep any follow-up appointments you made. If you do not go to an appointment, you could develop lasting (chronic) problems with pain. If there is any problem keeping an appointment, call to reschedule. SEEK MEDICAL CARE IF:   Your chest pain does not go away, even after treatment.  You have a rash with blisters on your chest.  You have a fever. SEEK IMMEDIATE MEDICAL CARE IF:   You have increased chest pain or pain that spreads to your arm, neck, jaw, back, or abdomen.  You have shortness of breath.  You have an increasing cough, or you cough up blood.  You have severe back or abdominal pain.  You feel nauseous or vomit.  You have severe weakness.  You faint.  You have chills. This is an emergency. Do not wait to see if the pain will go away. Get medical help at once. Call your local emergency services (911 in [place] Do not drive yourself to the hospital. MAKE SURE YOU:   Understand these instructions.  Will watch your condition.  Will get help right away if you are not doing well or get worse. Document Released: 01/09/2005 Document Revised: 04/06/2013 Document Reviewed: 11/05/2007 [facility] Patient Information 2015 [place], [place]. This information is not intended to replace advice given to you by your health care provider.  Make sure you discuss any questions you have with your health care provider.

## 2015-01-09 NOTE — ED Notes (Signed)
Patient transported to X-ray 

## 2015-01-09 NOTE — ED Provider Notes (Signed)
CSN: [id number]     Arrival date & time 01/09/15  0701 History   First MD Initiated Contact with Patient 01/09/15 [phone]     Chief Complaint  Patient presents with  . Chest Pain     (Consider location/radiation/quality/duration/timing/severity/associated sxs/prior Treatment) HPI  XZANDER GILHAM Is a 39 year old male who presents emergency Department with chief complaint of chest pain. Patient states that he awoke from sleep at 5 AM this morning with pain in his left chest radiating to his left arm. He describes it as aching and crampy. He denies any shortness of breath, nausea, cold sweats. The patient does have a history of acid reflux. He denies history of smoking, family history of MI, hypertension or hyperlipidemia. He states that his initial pain was a 6 out of 10 and is currently a 4 out of 10. He denies any recent heavy lifting. Patient states that he does drink liquor on the weekends when he goes out, but denies daily use. Past Medical History  Diagnosis Date  . Asthma   . GERD (gastroesophageal reflux disease)    History reviewed. No pertinent past surgical history. No family history on file. Social History  Substance Use Topics  . Smoking status: Never Smoker   . Smokeless tobacco: None  . Alcohol Use: Yes    Review of Systems  Ten systems reviewed and are negative for acute change, except as noted in the HPI.    Allergies  Penicillins  Home Medications   Prior to Admission medications   Not on File   BP 115/65 mmHg  Pulse 69  Temp(Src) 98 F (36.7 C)  Resp 14  Ht  (1.702 m)  Wt 214 lb (97.07 kg)  BMI 33.51 kg/m2  SpO2 100% Physical Exam  Constitutional: He appears well-developed and well-nourished. No distress.  HENT:  Head: Normocephalic and atraumatic.  Eyes: Conjunctivae are normal. No scleral icterus.  Neck: Normal range of motion. Neck supple.  Cardiovascular: Normal rate, regular rhythm and normal heart sounds.   Pulmonary/Chest: Effort  normal and breath sounds normal. No respiratory distress. He exhibits no tenderness.  Abdominal: Soft. There is no tenderness.  Musculoskeletal: He exhibits no edema.  Neurological: He is alert.  Skin: Skin is warm and dry. He is not diaphoretic.  Psychiatric: His behavior is normal.  Nursing note and vitals reviewed.   ED Course  Procedures (including critical care time) Labs Review Labs Reviewed  BASIC METABOLIC PANEL - Abnormal; Notable for the following:    Glucose, Bld 110 (*)    All other components within normal limits  CBC  MAGNESIUM  I-STAT TROPOININ, ED  Rosezena Sensor, ED    Imaging Review Dg Chest 2 View  01/09/2015   CLINICAL DATA:  Left chest pain  EXAM: CHEST  2 VIEW  COMPARISON:  08/25/2012  FINDINGS: The heart size and mediastinal contours are within normal limits. Both lungs are clear. The visualized skeletal structures are unremarkable.  IMPRESSION: No active cardiopulmonary disease.   Electronically Signed   By: Judie Petit.  Shick M.D.   On: 01/09/2015 07:53   I have personally reviewed and evaluated these images and lab results as part of my medical decision-making.   EKG Interpretation   Date/Time:  Monday January 09 2015 07:07:44 EDT Ventricular Rate:  74 PR Interval:  160 QRS Duration: 78 QT Interval:  420 QTC Calculation: 466 R Axis:   32 Text Interpretation:  Sinus rhythm with frequent Premature ventricular  complexes in a pattern of bigeminy  Nonspecific T wave abnormality  Prolonged QT Abnormal ECG Confirmed by POLLINA  MD, CHRISTOPHER [phone]) on  01/09/2015 8:38:08 AM      MDM   Final diagnoses:  Chest pain, unspecified chest pain type    8:15 PM Patient here with complaint of chest pain. His EKG is changed from previous, with PVCs in the a pattern of bigeminy, nonspecific T-wave abnormalities appear unchanged. Patient does have prolonged QT. Initial troponin negative. Magnesium is within normal limits. CBC and BMP are unchanged. Plan to  recheck a troponin.  8:15 PM Patient pain is completely resolved.  Awaiting delta troponin.  Patient with repeat negative troponin. HEART score of 1, low risk for MACE,. D/c with pcp/cardiology follow.   Arthor Captain, PA-C 01/09/15 2018  Gilda Crease, MD 01/10/15 2051

## 2015-01-09 NOTE — ED Notes (Signed)
Pt c/o left sided chest pain onset at 0500 today that woke him up out of his sleep. Pt has history of acid reflux but only takes medication when needed. Pt reports that he ate pizza 2 days in a row.

## 2016-04-17 ENCOUNTER — Other Ambulatory Visit: Payer: Self-pay | Admitting: Emergency Medicine

## 2016-04-17 ENCOUNTER — Ambulatory Visit (INDEPENDENT_AMBULATORY_CARE_PROVIDER_SITE_OTHER): Payer: Self-pay

## 2016-04-17 DIAGNOSIS — R52 Pain, unspecified: Secondary | ICD-10-CM

## 2016-04-17 DIAGNOSIS — M542 Cervicalgia: Secondary | ICD-10-CM

## 2016-04-18 ENCOUNTER — Emergency Department (HOSPITAL_COMMUNITY)
Admission: EM | Admit: 2016-04-18 | Discharge: 2016-04-18 | Disposition: A | Discharge status: Home / Self Care | Payer: Medicaid Other | Attending: Emergency Medicine | Admitting: Emergency Medicine

## 2016-04-18 ENCOUNTER — Encounter (HOSPITAL_COMMUNITY): Payer: Self-pay | Admitting: Nurse Practitioner

## 2016-04-18 DIAGNOSIS — S39012A Strain of muscle, fascia and tendon of lower back, initial encounter: Secondary | ICD-10-CM | POA: Insufficient documentation

## 2016-04-18 DIAGNOSIS — Y939 Activity, unspecified: Secondary | ICD-10-CM | POA: Insufficient documentation

## 2016-04-18 DIAGNOSIS — J45909 Unspecified asthma, uncomplicated: Secondary | ICD-10-CM | POA: Insufficient documentation

## 2016-04-18 DIAGNOSIS — Z79899 Other long term (current) drug therapy: Secondary | ICD-10-CM | POA: Insufficient documentation

## 2016-04-18 DIAGNOSIS — Y929 Unspecified place or not applicable: Secondary | ICD-10-CM | POA: Insufficient documentation

## 2016-04-18 DIAGNOSIS — Y990 Civilian activity done for income or pay: Secondary | ICD-10-CM | POA: Insufficient documentation

## 2016-04-18 DIAGNOSIS — S161XXA Strain of muscle, fascia and tendon at neck level, initial encounter: Secondary | ICD-10-CM

## 2016-04-18 MED ORDER — NAPROXEN 500 MG PO TABS: 500.0000 mg | ORAL_TABLET | Freq: Two times a day (BID) | ORAL | 0 refills | Status: DC

## 2016-04-18 MED ORDER — CYCLOBENZAPRINE HCL 10 MG PO TABS: 10.0000 mg | ORAL_TABLET | Freq: Two times a day (BID) | ORAL | 0 refills | Status: DC | PRN
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
## 2016-04-18 NOTE — ED Notes (Signed)
EDP at bedside  

## 2016-04-18 NOTE — Discharge Instructions (Signed)
SEEK IMMEDIATE MEDICAL ATTENTION IF: New numbness, tingling, weakness, or problem with the use of your arms or legs.  Severe back pain not relieved with medications.  Change in bowel or bladder control.  Increasing pain in any areas of the body (such as chest or abdominal pain).  Shortness of breath, dizziness or fainting.  Nausea (feeling sick to your stomach), vomiting, fever, or sweats.  

## 2016-04-19 ENCOUNTER — Ambulatory Visit (INDEPENDENT_AMBULATORY_CARE_PROVIDER_SITE_OTHER): Payer: Worker's Compensation

## 2016-04-19 ENCOUNTER — Other Ambulatory Visit: Payer: Self-pay | Admitting: Emergency Medicine

## 2016-04-19 DIAGNOSIS — T1490XA Injury, unspecified, initial encounter: Secondary | ICD-10-CM

## 2016-04-19 DIAGNOSIS — M542 Cervicalgia: Secondary | ICD-10-CM | POA: Diagnosis not present

## 2016-04-19 DIAGNOSIS — R531 Weakness: Secondary | ICD-10-CM | POA: Diagnosis not present

## 2016-04-24 ENCOUNTER — Ambulatory Visit (INDEPENDENT_AMBULATORY_CARE_PROVIDER_SITE_OTHER): Payer: Self-pay

## 2016-04-24 ENCOUNTER — Other Ambulatory Visit: Payer: Self-pay | Admitting: Emergency Medicine

## 2016-04-24 DIAGNOSIS — M546 Pain in thoracic spine: Secondary | ICD-10-CM

## 2016-04-24 DIAGNOSIS — M545 Low back pain: Secondary | ICD-10-CM

## 2016-07-03 ENCOUNTER — Encounter (HOSPITAL_COMMUNITY): Payer: Self-pay | Admitting: *Deleted

## 2016-07-03 ENCOUNTER — Ambulatory Visit (HOSPITAL_COMMUNITY)
Admission: EM | Admit: 2016-07-03 | Discharge: 2016-07-03 | Disposition: A | Discharge status: Home / Self Care | Payer: Medicaid Other | Attending: Emergency Medicine | Admitting: Emergency Medicine

## 2016-07-03 DIAGNOSIS — J029 Acute pharyngitis, unspecified: Secondary | ICD-10-CM | POA: Insufficient documentation

## 2016-07-03 DIAGNOSIS — Z880 Allergy status to penicillin: Secondary | ICD-10-CM | POA: Insufficient documentation

## 2016-07-03 DIAGNOSIS — R6889 Other general symptoms and signs: Secondary | ICD-10-CM

## 2016-07-03 DIAGNOSIS — R05 Cough: Secondary | ICD-10-CM | POA: Insufficient documentation

## 2016-07-03 LAB — POCT RAPID STREP A: Streptococcus, Group A Screen (Direct): NEGATIVE

## 2016-07-03 MED ORDER — OSELTAMIVIR PHOSPHATE 75 MG PO CAPS: 75.0000 mg | ORAL_CAPSULE | Freq: Two times a day (BID) | ORAL | 0 refills | Status: DC

## 2016-07-03 NOTE — ED Triage Notes (Signed)
Pt  Reports   sorethroat   Body  Aches   And  Fever         Pt  Took  Some  Tylenol  prior  To  Arriving  ucc    Alert  And oriented

## 2016-07-03 NOTE — Discharge Instructions (Signed)
You most likely have a viral URI such as influenza, or an influenza-like illness.  For treatment of influenza, I have prescribed Tamiflu. Take 1 tablet twice a day for 5 days. , I advise rest, plenty of fluids and management of symptoms with over the counter medicines. For symptoms you may take Tylenol as needed every 4-6 hours for body aches or fever, not to exceed 4,000 mg a day, Take mucinex or mucinex DM ever 12 hours with a full glass of water, you may use an inhaled steroid such as Flonase, 2 sprays each nostril once a day for congestion, or an antihistamine such as Claritin or Zyrtec once a day. Should your symptoms worsen or fail to resolve, follow up with your primary care provider or return to clinic.

## 2016-07-03 NOTE — ED Provider Notes (Signed)
CSN: [id number]     Arrival date & time 07/03/16  1823 History   None    Chief Complaint  Patient presents with  . Sore Throat   (Consider location/radiation/quality/duration/timing/severity/associated sxs/prior Treatment) 41 year old male presents to clinic with a 24-hour history of fever, muscle aches, body aches, fatigue, loss of appetite, and weakness. Along with sore throat, no cough, no nausea, vomiting, diarrhea, no abdominal pain, no chest pain, no shortness of breath, no wheezing, no heart palpitations, no swelling in his hands, feet, ankles, he does have bilateral ear pressure, but no weakness, dizziness, does have sinus congestion.   The history is provided by the patient.  Sore Throat  This is a new problem. The current episode started 12 to 24 hours ago. The problem occurs constantly. The problem has not changed since onset.Pertinent negatives include no chest pain, no abdominal pain and no shortness of breath. Nothing aggravates the symptoms. Nothing relieves the symptoms. He has tried acetaminophen for the symptoms. The treatment provided no relief.    Past Medical History:  Diagnosis Date  . Asthma   . GERD (gastroesophageal reflux disease)    History reviewed. No pertinent surgical history. History reviewed. No pertinent family history. Social History  Substance Use Topics  . Smoking status: Never Smoker  . Smokeless tobacco: Not on file  . Alcohol use Yes    Review of Systems  Constitutional: Positive for appetite change, chills, fatigue and fever.  HENT: Positive for congestion, ear pain, rhinorrhea and sore throat. Negative for ear discharge, sinus pain and sinus pressure.   Eyes: Negative.   Respiratory: Positive for cough. Negative for choking, shortness of breath and wheezing.   Cardiovascular: Negative.  Negative for chest pain and palpitations.  Gastrointestinal: Negative for abdominal pain, diarrhea, nausea and vomiting.  Genitourinary: Negative for  dysuria, flank pain and frequency.  Musculoskeletal: Negative.  Negative for arthralgias, back pain, myalgias, neck pain and neck stiffness.  Skin: Negative.   Neurological: Negative for dizziness, syncope, weakness and light-headedness.  All other systems reviewed and are negative.   Allergies  Penicillins  Home Medications   Prior to Admission medications   Medication Sig Start Date End Date Taking? Authorizing Provider  cyclobenzaprine (FLEXERIL) 10 MG tablet Take 1 tablet (10 mg total) by mouth 2 (two) times daily as needed for muscle spasms. 04/18/16   Arthor CaptainAbigail Harris, PA-C  oseltamivir (TAMIFLU) 75 MG capsule Take 1 capsule (75 mg total) by mouth every 12 (twelve) hours. 07/03/16   Dorena BodoLawrence Godfrey Tritschler, NP   Meds Ordered and Administered this Visit  Medications - No data to display  BP 130/76 (BP Location: Right Arm)   Pulse 88   Temp 99.5 F (37.5 C) (Oral)   Resp 14   SpO2 100%  No data found.   Physical Exam  Constitutional: He is oriented to person, place, and time. He appears well-developed and well-nourished. He appears ill. No distress.  HENT:  Head: Normocephalic and atraumatic.  Right Ear: Tympanic membrane and external ear normal.  Left Ear: Tympanic membrane and external ear normal.  Nose: Nose normal. Right sinus exhibits no maxillary sinus tenderness and no frontal sinus tenderness. Left sinus exhibits no maxillary sinus tenderness and no frontal sinus tenderness.  Mouth/Throat: Uvula is midline and oropharynx is clear and moist. No oropharyngeal exudate.  Eyes: Pupils are equal, round, and reactive to light.  Neck: Normal range of motion. Neck supple. No JVD present.  Cardiovascular: Normal rate and regular rhythm.   Pulmonary/Chest: Effort  normal and breath sounds normal. No respiratory distress. He has no wheezes.  Abdominal: Soft. Bowel sounds are normal.  Lymphadenopathy:       Head (right side): No submental, no submandibular, no tonsillar and no  preauricular adenopathy present.       Head (left side): No submental, no submandibular, no tonsillar and no preauricular adenopathy present.    He has no cervical adenopathy.  Neurological: He is alert and oriented to person, place, and time.  Skin: Skin is warm. Capillary refill takes less than 2 seconds. No rash noted. He is diaphoretic. No erythema.  Psychiatric: He has a normal mood and affect. His behavior is normal.  Nursing note and vitals reviewed.   Urgent Care Course     Procedures (including critical care time)  Labs Review Labs Reviewed  POCT RAPID STREP A    Imaging Review No results found.         MDM   1. Flu-like symptoms    Treating for possible influenza, prescribed Tamiflu, provided counseling on over-the-counter medicines for symptom management, guidelines for follow-up. Should symptoms persist, or fail to resolve, follow up with primary care, to the emergency room at any time if they worsen.    Dorena Bodo, NP 07/03/16 1943

## 2016-07-05 ENCOUNTER — Encounter (HOSPITAL_COMMUNITY): Payer: Self-pay | Admitting: *Deleted

## 2016-07-05 DIAGNOSIS — J020 Streptococcal pharyngitis: Secondary | ICD-10-CM | POA: Insufficient documentation

## 2016-07-05 DIAGNOSIS — Z79899 Other long term (current) drug therapy: Secondary | ICD-10-CM | POA: Insufficient documentation

## 2016-07-05 DIAGNOSIS — J45909 Unspecified asthma, uncomplicated: Secondary | ICD-10-CM | POA: Insufficient documentation

## 2016-07-05 LAB — RAPID STREP SCREEN (MED CTR MEBANE ONLY): Streptococcus, Group A Screen (Direct): POSITIVE — AB

## 2016-07-05 NOTE — ED Triage Notes (Signed)
The pt has been ill since Tuesday  He has had a cold cough headache sorethroat earache and body aches.  Unknown temp

## 2016-07-06 ENCOUNTER — Emergency Department (HOSPITAL_COMMUNITY)
Admission: EM | Admit: 2016-07-06 | Discharge: 2016-07-06 | Disposition: A | Discharge status: Home / Self Care | Payer: Medicaid Other | Attending: Emergency Medicine | Admitting: Emergency Medicine

## 2016-07-06 DIAGNOSIS — J020 Streptococcal pharyngitis: Secondary | ICD-10-CM

## 2016-07-06 LAB — CULTURE, GROUP A STREP (THRC)

## 2016-07-06 MED ORDER — IBUPROFEN 100 MG/5ML PO SUSP
600.0000 mg | Freq: Once | ORAL | Status: AC
  Administered 2016-07-06: 600 mg via ORAL
  Filled 2016-07-06: qty 30

## 2016-07-06 MED ORDER — ACETAMINOPHEN-CODEINE 120-12 MG/5ML PO SOLN
5.0000 mL | Freq: Once | ORAL | Status: AC
  Administered 2016-07-06: 5 mL via ORAL
  Filled 2016-07-06: qty 5

## 2016-07-06 MED ORDER — PENICILLIN G BENZATHINE 1200000 UNIT/2ML IM SUSP
1.2000 10*6.[IU] | Freq: Once | INTRAMUSCULAR | Status: AC
  Administered 2016-07-06: 1.2 10*6.[IU] via INTRAMUSCULAR
  Filled 2016-07-06: qty 2

## 2016-07-06 NOTE — ED Provider Notes (Signed)
[facility]-EMERGENCY DEPT Provider Note   CSN: [id number] Arrival date & time: 07/05/16  2209     History   Chief Complaint Chief Complaint  Patient presents with  . Generalized Body Aches    HPI DOIS Tom Miranda is a 41 y.o. male who presents to the ED with sore throat, fever, chills and body aches. The symptoms started 3 days ago and have gotten worse. He has taken tylenol without relief.   The history is provided by the patient. No language interpreter was used.  Sore Throat  This is a new problem. The current episode started more than 2 days ago. The problem occurs constantly. The problem has been gradually worsening. Associated symptoms include headaches. Pertinent negatives include no chest pain and no abdominal pain. The symptoms are aggravated by swallowing and eating. Nothing relieves the symptoms. He has tried acetaminophen for the symptoms. The treatment provided no relief.    Past Medical History:  Diagnosis Date  . Asthma   . GERD (gastroesophageal reflux disease)     There are no active problems to display for this patient.   History reviewed. No pertinent surgical history.     Home Medications    Prior to Admission medications   Medication Sig Start Date End Date Taking? Authorizing Provider  cyclobenzaprine (FLEXERIL) 10 MG tablet Take 1 tablet (10 mg total) by mouth 2 (two) times daily as needed for muscle spasms. 04/18/16   Arthor Captain, PA-C  oseltamivir (TAMIFLU) 75 MG capsule Take 1 capsule (75 mg total) by mouth every 12 (twelve) hours. 07/03/16   Dorena Bodo, NP    Family History No family history on file.  Social History Social History  Substance Use Topics  . Smoking status: Never Smoker  . Smokeless tobacco: Never Used  . Alcohol use Yes     Allergies   Patient has no active allergies.   Review of Systems Review of Systems  Constitutional: Positive for chills and fever.  HENT: Positive for ear pain and sore throat. Negative for  congestion and trouble swallowing.   Eyes: Negative for discharge and itching.  Respiratory: Negative for cough and wheezing.   Cardiovascular: Negative for chest pain.  Gastrointestinal: Negative for abdominal pain, nausea and vomiting.  Musculoskeletal: Positive for myalgias.  Skin: Negative for rash.  Neurological: Positive for headaches.  Hematological: Positive for adenopathy.  Psychiatric/Behavioral: Negative for confusion.     Physical Exam Updated Vital Signs BP 115/62 (BP Location: Right Arm)   Pulse 92   Temp (!) 100.6 F (38.1 C) (Oral)   Resp 16   Ht 5\' 8"  (1.727 m)   Wt 96.7 kg   SpO2 96%   BMI 32.42 kg/m   Physical Exam  Constitutional: He appears well-developed and well-nourished. No distress.  HENT:  Head: Normocephalic and atraumatic.  Right Ear: Tympanic membrane normal.  Left Ear: Tympanic membrane normal.  Nose: Nose normal.  Mouth/Throat: Uvula is midline and mucous membranes are normal. Posterior oropharyngeal erythema present. No tonsillar abscesses. Tonsils are 3+ on the right. Tonsils are 3+ on the left. Tonsillar exudate.  Eyes: EOM are normal.  Neck: Normal range of motion. Neck supple.  No meningeal signs.  Cardiovascular: Normal rate and regular rhythm.   Pulmonary/Chest: Effort normal and breath sounds normal.  Abdominal: Soft. There is no tenderness.  Musculoskeletal: Normal range of motion.  Lymphadenopathy:    He has cervical adenopathy.  Neurological: He is alert.  Skin: Skin is warm and dry.  Psychiatric: He has  a normal mood and affect. His behavior is normal.  Nursing note and vitals reviewed.    ED Treatments / Results  Labs (all labs ordered are listed, but only abnormal results are displayed) Labs Reviewed  RAPID STREP SCREEN (NOT AT [facility]) - Abnormal; Notable for the following:       Result Value   Streptococcus, Group A Screen (Direct) POSITIVE (*)    All other components within normal limits   Radiology No results  found.  Procedures Procedures (including critical care time)  Medications Ordered in ED Medications  penicillin g benzathine (BICILLIN LA) 1200000 UNIT/2ML injection 1.2 Million Units (not administered)  acetaminophen-codeine 120-12 MG/5ML solution 5 mL (not administered)  ibuprofen (ADVIL,MOTRIN) 100 MG/5ML suspension 600 mg (not administered)     Initial Impression / Assessment and Plan / ED Course  I have reviewed the triage vital signs and the nursing notes.  Pertinent labs results that were available during my care of the patient were reviewed by me and considered in my medical decision making.  Final Clinical Impressions(s) / ED Diagnoses  41 y.o. male with sore throat and fever and body aches x 3 day stable for d/c without meningeal signs. Treated with Penicillin injection 1.2 MU IM. Discussed with the patient clinical and lab findings and plan of care. All questioned fully answered. He will return if any problems arise.   Final diagnoses:  Streptococcal pharyngitis    New Prescriptions New Prescriptions   No medications on file     [facility] Derrel Moore, NP 07/06/16 [id number]    Azalia BilisKevin Campos, MD 07/06/16 [phone]

## 2016-07-06 NOTE — Discharge Instructions (Signed)
We have given you an injection of penicillin to treat your strep throat. Take tylenol and ibuprofen for fever and pain.

## 2016-07-06 NOTE — ED Notes (Signed)
Pt stable, understands discharge instructions, and reasons for return.   

## 2017-04-15 ENCOUNTER — Other Ambulatory Visit: Payer: Self-pay

## 2017-04-15 DIAGNOSIS — J45909 Unspecified asthma, uncomplicated: Secondary | ICD-10-CM | POA: Insufficient documentation

## 2017-04-15 DIAGNOSIS — M25531 Pain in right wrist: Secondary | ICD-10-CM | POA: Diagnosis not present

## 2017-04-16 ENCOUNTER — Emergency Department (HOSPITAL_COMMUNITY): Payer: BLUE CROSS/BLUE SHIELD

## 2017-04-16 ENCOUNTER — Other Ambulatory Visit: Payer: Self-pay

## 2017-04-16 ENCOUNTER — Emergency Department (HOSPITAL_COMMUNITY)
Admission: EM | Admit: 2017-04-16 | Discharge: 2017-04-16 | Disposition: A | Discharge status: Home / Self Care | Payer: BLUE CROSS/BLUE SHIELD | Attending: Emergency Medicine | Admitting: Emergency Medicine

## 2017-04-16 ENCOUNTER — Encounter (HOSPITAL_COMMUNITY): Payer: Self-pay | Admitting: *Deleted

## 2017-04-16 DIAGNOSIS — M25531 Pain in right wrist: Secondary | ICD-10-CM

## 2017-04-16 MED ORDER — IBUPROFEN 600 MG PO TABS: 600.0000 mg | ORAL_TABLET | Freq: Three times a day (TID) | ORAL | 0 refills | Status: DC | PRN

## 2017-04-16 NOTE — ED Notes (Signed)
See provider assessment 

## 2017-04-16 NOTE — ED Notes (Signed)
Unable to sign d/t hallway bed and no available computer on wheels

## 2017-04-16 NOTE — ED Provider Notes (Signed)
[facility] EMERGENCY DEPARTMENT Provider Note   CSN: [id number] Arrival date & time: 04/15/17  2345     History   Chief Complaint Chief Complaint  Patient presents with  . Wrist Pain    HPI Tom Miranda is a 42 y.o. male.  HPI Reports injury to his right wrist 1 week ago. Continued pain in right wrist, worse with ROM. No other complaints. Object was falling towards him and he stopped it with his right hand   Past Medical History:  Diagnosis Date  . Asthma   . GERD (gastroesophageal reflux disease)     There are no active problems to display for this patient.   History reviewed. No pertinent surgical history.     Home Medications    Prior to Admission medications   Medication Sig Start Date End Date Taking? Authorizing Provider  ibuprofen (ADVIL,MOTRIN) 600 MG tablet Take 1 tablet (600 mg total) by mouth every 8 (eight) hours as needed. 04/16/17   Azalia Bilis, MD    Family History No family history on file.  Social History Social History   Tobacco Use  . Smoking status: Never Smoker  . Smokeless tobacco: Never Used  Substance Use Topics  . Alcohol use: Yes  . Drug use: No     Allergies   Patient has no known allergies.   Review of Systems Review of Systems  All other systems reviewed and are negative.    Physical Exam Updated Vital Signs BP (!) 109/42 (BP Location: Right Arm)   Pulse (!) 45   Temp 97.8 F (36.6 C) (Oral)   Resp 17   Ht 5\' 8"  (1.727 m)   Wt 97.1 kg (214 lb)   SpO2 99%   BMI 32.54 kg/m   Physical Exam  Constitutional: He is oriented to person, place, and time. He appears well-developed and well-nourished.  HENT:  Head: Normocephalic.  Eyes: EOM are normal.  Neck: Normal range of motion.  Pulmonary/Chest: Effort normal.  Abdominal: He exhibits no distension.  Musculoskeletal: Normal range of motion.  Mild pain with ROM of right wrist. No obvious swelling. Normal right radial pulse    Neurological: He is alert and oriented to person, place, and time.  Psychiatric: He has a normal mood and affect.  Nursing note and vitals reviewed.    ED Treatments / Results  Labs (all labs ordered are listed, but only abnormal results are displayed) Labs Reviewed - No data to display  EKG  EKG Interpretation None       Radiology Dg Wrist Complete Right  Result Date: 04/16/2017 CLINICAL DATA:  Painful right wrist after injury 2 weeks ago. EXAM: RIGHT WRIST - COMPLETE 3+ VIEW COMPARISON:  None. FINDINGS: There is no evidence of fracture or dislocation. There is no evidence of arthropathy or other focal bone abnormality. Soft tissues are unremarkable. IMPRESSION: Negative. Electronically Signed   By: Burman Nieves M.D.   On: 04/16/2017 00:32    Procedures Procedures (including critical care time)  Medications Ordered in ED Medications - No data to display   Initial Impression / Assessment and Plan / ED Course  I have reviewed the triage vital signs and the nursing notes.  Pertinent labs & imaging results that were available during my care of the patient were reviewed by me and considered in my medical decision making (see chart for details).     1 week out from injury. Negative xrays. Wrist splint for comfort  Final Clinical Impressions(s) /  ED Diagnoses   Final diagnoses:  Acute pain of right wrist    ED Discharge Orders        Ordered    ibuprofen (ADVIL,MOTRIN) 600 MG tablet  Every 8 hours PRN     04/16/17 0251       Azalia Bilisampos, Shantika Bermea, MD 04/16/17 [phone]

## 2017-04-16 NOTE — ED Triage Notes (Signed)
Painful rt wrist.  Something almost fell on it 1-2 weeks ago  He stopped the falling object with his rt wrist

## 2017-04-26 ENCOUNTER — Encounter (HOSPITAL_COMMUNITY): Payer: Self-pay | Admitting: Emergency Medicine

## 2017-04-26 ENCOUNTER — Emergency Department (HOSPITAL_COMMUNITY)
Admission: EM | Admit: 2017-04-26 | Discharge: 2017-04-26 | Disposition: A | Discharge status: Home / Self Care | Payer: BLUE CROSS/BLUE SHIELD | Attending: Emergency Medicine | Admitting: Emergency Medicine

## 2017-04-26 ENCOUNTER — Other Ambulatory Visit: Payer: Self-pay

## 2017-04-26 DIAGNOSIS — H61892 Other specified disorders of left external ear: Secondary | ICD-10-CM | POA: Diagnosis not present

## 2017-04-26 DIAGNOSIS — J020 Streptococcal pharyngitis: Secondary | ICD-10-CM | POA: Diagnosis not present

## 2017-04-26 DIAGNOSIS — J029 Acute pharyngitis, unspecified: Secondary | ICD-10-CM | POA: Diagnosis present

## 2017-04-26 DIAGNOSIS — R6883 Chills (without fever): Secondary | ICD-10-CM | POA: Insufficient documentation

## 2017-04-26 DIAGNOSIS — R590 Localized enlarged lymph nodes: Secondary | ICD-10-CM | POA: Diagnosis not present

## 2017-04-26 DIAGNOSIS — J45909 Unspecified asthma, uncomplicated: Secondary | ICD-10-CM | POA: Insufficient documentation

## 2017-04-26 DIAGNOSIS — M545 Low back pain, unspecified: Secondary | ICD-10-CM

## 2017-04-26 DIAGNOSIS — J392 Other diseases of pharynx: Secondary | ICD-10-CM | POA: Insufficient documentation

## 2017-04-26 LAB — RAPID STREP SCREEN (MED CTR MEBANE ONLY): Streptococcus, Group A Screen (Direct): POSITIVE — AB

## 2017-04-26 MED ORDER — AMOXICILLIN 500 MG PO CAPS: 500.0000 mg | ORAL_CAPSULE | Freq: Two times a day (BID) | ORAL | 0 refills | Status: DC

## 2017-04-26 NOTE — ED Provider Notes (Signed)
[facility] EMERGENCY DEPARTMENT Provider Note   CSN: [id number] Arrival date & time: 04/26/17  1652     History   Chief Complaint Chief Complaint  Patient presents with  . Sore Throat    HPI Tom Miranda is a 42 y.o. male with a history of asthma and GERD who presents the emergency department complaining of progressively worsening constant sore throat for the past 2 days.  Patient states the pain is a 5 out of 10 in severity at present, states that he has been taking ibuprofen with improvement, no specific worsening factors.  States he has had associated chills, no fevers.  Positive recent sick contact with daughter who was diagnosed with strep throat.  Denies congestion, rhinorrhea, ear pain, cough, difficulty breathing, or difficulty swallowing.  Upon further inquiry patient did report some diffuse lower back pain, no injury, improved with ibuprofen. Denies numbness, tingling, weakness, incontinence to bowel/bladder, fever,  IV drug use, or hx of cancer.   HPI  Past Medical History:  Diagnosis Date  . Asthma   . GERD (gastroesophageal reflux disease)     There are no active problems to display for this patient.   History reviewed. No pertinent surgical history.     Home Medications    Prior to Admission medications   Medication Sig Start Date End Date Taking? Authorizing Provider  ibuprofen (ADVIL,MOTRIN) 600 MG tablet Take 1 tablet (600 mg total) by mouth every 8 (eight) hours as needed. 04/16/17  Frederic Jericho, MD    Family History History reviewed. No pertinent family history.  Social History Social History   Tobacco Use  . Smoking status: Never Smoker  . Smokeless tobacco: Never Used  Substance Use Topics  . Alcohol use: Yes  . Drug use: No     Allergies   Patient has no known allergies.   Review of Systems Review of Systems  Constitutional: Positive for chills. Negative for fever.  HENT: Positive for sore throat. Negative  for congestion, ear pain, rhinorrhea and trouble swallowing.   Eyes: Negative for discharge and visual disturbance.  Respiratory: Negative for cough and shortness of breath.   Cardiovascular: Negative for chest pain.  Gastrointestinal: Negative for abdominal pain, blood in stool, nausea and vomiting.  Genitourinary: Negative for hematuria.  Musculoskeletal: Positive for back pain.  Neurological: Negative for weakness and numbness.       Negative for incontinence or saddle anesthesia  All other systems reviewed and are negative.    Physical Exam Updated Vital Signs BP 116/63 (BP Location: Right Arm)   Pulse 99   Temp 99.7 F (37.6 C) (Oral)   Resp 18   SpO2 99%   Physical Exam  Constitutional: He appears well-developed and well-nourished.  Non-toxic appearance. No distress.  HENT:  Head: Normocephalic and atraumatic.  Right Ear: No mastoid tenderness. Tympanic membrane is not perforated, not erythematous, not retracted and not bulging.  Left Ear: No mastoid tenderness. Tympanic membrane is erythematous (mild). Tympanic membrane is not perforated, not retracted and not bulging.  Nose: Nose normal.  Mouth/Throat: Uvula is midline. No trismus in the jaw. No uvula swelling. Posterior oropharyngeal erythema present. No oropharyngeal exudate. Tonsils are 2+ on the right. Tonsils are 3+ on the left.  Patient tolerating his own secretions without difficulty. No hot potato voice.   Eyes: Conjunctivae are normal. Right eye exhibits no discharge. Left eye exhibits no discharge.  Neck: Full passive range of motion without pain. No spinous process tenderness and  no muscular tenderness present.  Submandibular compartment is soft  Cardiovascular: Normal rate and regular rhythm.  No murmur heard. Pulses:      Popliteal pulses are 2+ on the right side, and 2+ on the left side.  Pulmonary/Chest: Breath sounds normal. No respiratory distress. He has no wheezes. He has no rales.  Abdominal: Soft.  He exhibits no distension. There is no tenderness.  Musculoskeletal:  Back: No obvious deformity, erythema, or ecchymosis.  No midline tenderness.  Patient with left lumbar paraspinal muscle tenderness which is mild.  Lymphadenopathy:    He has cervical adenopathy (mild bilateral anterior).  Neurological: He is alert.  Clear speech.  Sensation intact to sharp and dull touch to bilateral lower extremities.  Patient with 5 out of 5 strength with plantar and dorsiflexion bilaterally.  Gait is normal.  Skin: Skin is warm and dry. No rash noted.  Psychiatric: He has a normal mood and affect. His behavior is normal.  Nursing note and vitals reviewed.  ED Treatments / Results  Labs (all labs ordered are listed, but only abnormal results are displayed) Labs Reviewed  RAPID STREP SCREEN (NOT AT [facility]) - Abnormal; Notable for the following components:      Result Value   Streptococcus, Group A Screen (Direct) POSITIVE (*)    All other components within normal limits    EKG  EKG Interpretation None      Radiology No results found.  Procedures Procedures (including critical care time)  Medications Ordered in ED Medications - No data to display   Initial Impression / Assessment and Plan / ED Course  I have reviewed the triage vital signs and the nursing notes.  Pertinent labs & imaging results that were available during my care of the patient were reviewed by me and considered in my medical decision making (see chart for details).  Patient presents with complaints of sore throat and back pain.  Nontoxic-appearing, vitals within normal limits.  Patient with tonsillar erythema and anterior cervical lymphadenopathy, rapid strep test positive.  Discussed treatment with IM penicillin in the emergency department, patient requesting outpatient prescription for antibiotic, states he does not want to get a shot, I am in agreement with this.  Will provide prescription for amoxicillin.   Presentation non concerning for PTA or RPA. No trismus or uvula deviation, he is tolerating his own secretions without difficulty, full range of motion of the neck, submandibular compartment is soft, no hot potato voice.    Patient with back pain.  No midline tenderness on exam, only left paraspinal muscle tenderness.  No neurological deficits and normal neuro exam.  Patient can walk without difficulty.  No loss of bowel or bladder control.  No concern for cauda equina.  No fever, night sweats, weight loss, h/o cancer, IVDU.  Recommended  continued treatment with anti-inflammatory medications.  I discussed results, treatment plan, need for PCP follow-up, and return precautions with the patient. Provided opportunity for questions, patient confirmed understanding and is in agreement with plan.   Final Clinical Impressions(s) / ED Diagnoses   Final diagnoses:  Strep throat  Acute left-sided low back pain without sciatica    ED Discharge Orders        Ordered    amoxicillin (AMOXIL) 500 MG capsule  2 times daily     04/26/17 [address]1820       Ashur Glatfelter, [place] R, PA-C 04/26/17 [place] [id number]    Alvira MondaySchlossman, Erin, MD 04/27/17 1402

## 2017-04-26 NOTE — ED Notes (Signed)
Declined W/C at D/C and was escorted to lobby by RN. 

## 2017-04-26 NOTE — Discharge Instructions (Signed)
You were seen in the emergency department today and diagnosed with strep throat.  I given you a prescription for amoxicillin, this is an antibiotic used to treat strep throat. Please take all of your antibiotics until finished. You may develop abdominal discomfort or diarrhea from the antibiotic.  You may help offset this with probiotics which you can buy at the store (ask your pharmacist if unable to find) or get probiotics in the form of eating yogurt. Do not eat or take the probiotics until 2 hours after your antibiotic. If you are unable to tolerate these side effects follow-up with your primary care provider or return to the emergency department.   If you begin to experience any blistering, rashes, swelling, or difficulty breathing seek medical care for evaluation of potentially more serious side effects.   Please be aware that this medication may interact with other medications you are taking, please be sure to discuss your medication list with your pharmacist.   Continue to take ibuprofen for the discomfort in your throat and in your back.  Follow-up with your primary care provider in the next 1 week if you are not feeling better.  Return to the emergency department for any new or worsening symptoms including but not limited to worsening of your pain, inability to open your mouth, inability to move your neck, inability to swallow your saliva, change in your voice, or any other concerns. Additionally return if your back pain worsens, if you hav numbness, weakness, loss of bowel or bladder control, fever, difficulty walking

## 2017-04-26 NOTE — ED Triage Notes (Signed)
Pt presents to ED after he woke up this morning with sore throat and lower back pain.  Daughter currently being treated for strep throat.

## 2017-05-14 ENCOUNTER — Emergency Department (HOSPITAL_COMMUNITY)
Admission: EM | Admit: 2017-05-14 | Discharge: 2017-05-14 | Disposition: A | Discharge status: Home / Self Care | Payer: BLUE CROSS/BLUE SHIELD | Attending: Emergency Medicine | Admitting: Emergency Medicine

## 2017-05-14 ENCOUNTER — Other Ambulatory Visit: Payer: Self-pay

## 2017-05-14 ENCOUNTER — Encounter (HOSPITAL_COMMUNITY): Payer: Self-pay | Admitting: *Deleted

## 2017-05-14 ENCOUNTER — Emergency Department (HOSPITAL_COMMUNITY): Payer: BLUE CROSS/BLUE SHIELD

## 2017-05-14 DIAGNOSIS — S60221D Contusion of right hand, subsequent encounter: Secondary | ICD-10-CM | POA: Diagnosis present

## 2017-05-14 DIAGNOSIS — J45909 Unspecified asthma, uncomplicated: Secondary | ICD-10-CM | POA: Insufficient documentation

## 2017-05-14 DIAGNOSIS — W228XXD Striking against or struck by other objects, subsequent encounter: Secondary | ICD-10-CM | POA: Insufficient documentation

## 2017-05-14 DIAGNOSIS — Z79899 Other long term (current) drug therapy: Secondary | ICD-10-CM | POA: Insufficient documentation

## 2017-05-14 MED ORDER — NAPROXEN 500 MG PO TABS: 500.0000 mg | ORAL_TABLET | Freq: Two times a day (BID) | ORAL | 0 refills | Status: DC

## 2017-05-14 NOTE — Discharge Instructions (Addendum)
Please read attached information regarding your condition. Wear Ace wrap as directed. Take naproxen as needed for pain and swelling. Follow-up with hand specialist listed below for further evaluation. Return to ED for worsening symptoms, additional injuries or falls, numbness in arms or legs, red hot or tender joint.

## 2017-05-14 NOTE — ED Provider Notes (Signed)
[facility]GENCY [facility] Provider Note   CSN: [id number] Arrival date & time: 05/14/17  1054     History   Chief Complaint Chief Complaint  Patient presents with  . Hand Problem    HPI Tom Miranda is a 42 y.o. male who presents to ED for evaluation of ongoing right hand pain which originated in right wrist approximately 1 month ago.  He was seen and evaluated when symptoms began.  He reports that he works at [employer].  He saw a piece of furniture falling so he caught it with his right hand.  This was approximately 1 month ago and he has been having pain since then.  He was seen and evaluated with negative x-rays and placed in a wrist splint and given anti-inflammatories.  He states that his wrist pain improved but now he is having pain of the right second and third digits.  Denies any reinjury, numbness to extremities, red hot or tender joint, fevers, prior fracture, dislocations or procedures in the area.  HPI  Past Medical History:  Diagnosis Date  . Asthma   . GERD (gastroesophageal reflux disease)     There are no active problems to display for this patient.   History reviewed. No pertinent surgical history.     Home Medications    Prior to Admission medications   Medication Sig Start Date End Date Taking? Authorizing Provider  amoxicillin (AMOXIL) 500 MG capsule Take 1 capsule (500 mg total) by mouth 2 (two) times daily. 04/26/17   Petrucelli, Samantha R, PA-C  ibuprofen (ADVIL,MOTRIN) 600 MG tablet Take 1 tablet (600 mg total) by mouth every 8 (eight) hours as needed. 04/16/17   Azalia Bilisampos, Kevin, MD  naproxen (NAPROSYN) 500 MG tablet Take 1 tablet (500 mg total) by mouth 2 (two) times daily. 05/14/17   Dietrich PatesKhatri, Cameka Rae, PA-C    Family History History reviewed. No pertinent family history.  Social History Social History   Tobacco Use  . Smoking status: Never Smoker  . Smokeless tobacco: Never Used  Substance Use Topics  .  Alcohol use: Yes  . Drug use: No     Allergies   Patient has no known allergies.   Review of Systems Review of Systems  Constitutional: Negative for chills and fever.  Gastrointestinal: Negative for nausea and vomiting.  Musculoskeletal: Positive for arthralgias. Negative for back pain, gait problem, joint swelling, myalgias, neck pain and neck stiffness.  Neurological: Negative for numbness.     Physical Exam Updated Vital Signs BP (!) 141/59 (BP Location: Right Arm)   Pulse (!) 50   Temp 98.3 F (36.8 C) (Oral)   Resp 18   SpO2 100%   Physical Exam  Constitutional: He appears well-developed and well-nourished. No distress.  HENT:  Head: Normocephalic and atraumatic.  Eyes: Conjunctivae and EOM are normal. No scleral icterus.  Neck: Normal range of motion.  Pulmonary/Chest: Effort normal. No respiratory distress.  Musculoskeletal:  Tenderness to palpation of the medial side of the right second digit with no visible deformity, erythema, edema of joints or hand.  Full active and passive range of motion of digits and wrist.  2+ radial pulse noted bilaterally.  Equal grip strength bilaterally.  Sensation intact light touch of bilateral upper extremities.  Neurological: He is alert.  Skin: No rash noted. He is not diaphoretic.  Psychiatric: He has a normal mood and affect.  Nursing note and vitals reviewed.    ED Treatments / Results  Labs (all labs ordered are listed, but only abnormal results are displayed) Labs Reviewed - No data to display  EKG  EKG Interpretation None       Radiology Dg Hand Complete Right  Result Date: 05/14/2017 CLINICAL DATA:  Pain about the right second and third MCP joint since an unspecified injury 1 month ago. Initial encounter. EXAM: RIGHT HAND - COMPLETE 3+ VIEW COMPARISON:  Plain films right wrist 04/16/2017. FINDINGS: There is no evidence of fracture or dislocation. There is no evidence of arthropathy or other focal bone  abnormality. Soft tissues are unremarkable. IMPRESSION: Normal exam. Electronically Signed   By: Drusilla Kanner M.D.   On: 05/14/2017 12:11    Procedures Procedures (including critical care time)  Medications Ordered in ED Medications - No data to display   Initial Impression / Assessment and Plan / ED Course  I have reviewed the triage vital signs and the nursing notes.  Pertinent labs & imaging results that were available during my care of the patient were reviewed by me and considered in my medical decision making (see chart for details).     Patient presents to ED for evaluation of ongoing right hand pain since incident 1 month ago at work.  States that he caught a heavy piece of furniture from falling with his right hand.  X-ray done during initial visit 3 weeks ago as well as x-rays done today have both been negative.  Here he denies any reinjury.  His physical examination is unremarkable with equal grip strengths, no changes in sensation or pulses.  There is no visible edema or erythema concerning for infected joint.  I suspect that this is due to a hand contusion.  Will give patient Ace wrap, anti-inflammatories and advised him to follow-up with hand specialist for further evaluation.  Patient appears stable for discharge at this time.  Strict return precautions given.  Portions of this note were generated with Scientist, clinical (histocompatibility and immunogenetics). Dictation errors may occur despite best attempts at proofreading.   Final Clinical Impressions(s) / ED Diagnoses   Final diagnoses:  Contusion of right hand, subsequent encounter    ED Discharge Orders        Ordered    naproxen (NAPROSYN) 500 MG tablet  2 times daily     05/14/17 1225       Dietrich Pates, PA-C 05/14/17 1228    Azalia Bilis, MD 05/14/17 1700

## 2017-05-14 NOTE — ED Notes (Signed)
Patient transported to X-ray 

## 2017-05-14 NOTE — ED Triage Notes (Signed)
Pt reports injuring right hand at work one month ago. Has been seen for it and had xray done of wrist. States now his pain is more in his hand, increases when grabbing objects. No distress is noted at triage.

## 2017-08-17 ENCOUNTER — Emergency Department (HOSPITAL_COMMUNITY)
Admission: EM | Admit: 2017-08-17 | Discharge: 2017-08-18 | Disposition: A | Discharge status: Home / Self Care | Payer: Medicaid Other | Attending: Emergency Medicine | Admitting: Emergency Medicine

## 2017-08-17 ENCOUNTER — Encounter (HOSPITAL_COMMUNITY): Payer: Self-pay | Admitting: Emergency Medicine

## 2017-08-17 ENCOUNTER — Emergency Department (HOSPITAL_COMMUNITY): Payer: Medicaid Other

## 2017-08-17 ENCOUNTER — Emergency Department (HOSPITAL_COMMUNITY)
Admission: EM | Admit: 2017-08-17 | Discharge: 2017-08-17 | Disposition: A | Discharge status: Home / Self Care | Payer: BLUE CROSS/BLUE SHIELD

## 2017-08-17 DIAGNOSIS — J45909 Unspecified asthma, uncomplicated: Secondary | ICD-10-CM | POA: Diagnosis not present

## 2017-08-17 DIAGNOSIS — Y92410 Unspecified street and highway as the place of occurrence of the external cause: Secondary | ICD-10-CM | POA: Insufficient documentation

## 2017-08-17 DIAGNOSIS — Y9389 Activity, other specified: Secondary | ICD-10-CM | POA: Insufficient documentation

## 2017-08-17 DIAGNOSIS — Y999 Unspecified external cause status: Secondary | ICD-10-CM | POA: Diagnosis not present

## 2017-08-17 DIAGNOSIS — Z79899 Other long term (current) drug therapy: Secondary | ICD-10-CM | POA: Insufficient documentation

## 2017-08-17 DIAGNOSIS — W25XXXA Contact with sharp glass, initial encounter: Secondary | ICD-10-CM | POA: Diagnosis not present

## 2017-08-17 DIAGNOSIS — S61216A Laceration without foreign body of right little finger without damage to nail, initial encounter: Secondary | ICD-10-CM | POA: Insufficient documentation

## 2017-08-17 DIAGNOSIS — S61219A Laceration without foreign body of unspecified finger without damage to nail, initial encounter: Secondary | ICD-10-CM
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
## 2017-08-17 NOTE — ED Notes (Signed)
Patient anxious at check-in, wanted to know if the city would pay for him to be assessed since he got his injury helping someone else.  Did not initially want to check in.  Now up with stickers stating he will care for it at home and he does not want to be seen.  Patient did not allow this RN time to review return precautions or to look at laceration

## 2017-08-18 MED ORDER — LIDOCAINE HCL (PF) 1 % IJ SOLN
10.0000 mL | Freq: Once | INTRAMUSCULAR | Status: AC
  Administered 2017-08-18: 10 mL
  Filled 2017-08-18: qty 10
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
## 2018-02-27 ENCOUNTER — Encounter (HOSPITAL_COMMUNITY): Payer: Self-pay

## 2018-02-27 ENCOUNTER — Other Ambulatory Visit: Payer: Self-pay

## 2018-02-27 ENCOUNTER — Emergency Department (HOSPITAL_COMMUNITY)
Admission: EM | Admit: 2018-02-27 | Discharge: 2018-02-27 | Disposition: A | Discharge status: Home / Self Care | Payer: Medicaid Other | Attending: Emergency Medicine | Admitting: Emergency Medicine

## 2018-02-27 DIAGNOSIS — J029 Acute pharyngitis, unspecified: Secondary | ICD-10-CM

## 2018-02-27 DIAGNOSIS — J45909 Unspecified asthma, uncomplicated: Secondary | ICD-10-CM | POA: Insufficient documentation

## 2018-02-27 DIAGNOSIS — B9789 Other viral agents as the cause of diseases classified elsewhere: Secondary | ICD-10-CM | POA: Insufficient documentation

## 2018-02-27 DIAGNOSIS — J028 Acute pharyngitis due to other specified organisms: Secondary | ICD-10-CM | POA: Insufficient documentation

## 2018-02-27 LAB — GROUP A STREP BY PCR: GROUP A STREP BY PCR: NOT DETECTED

## 2018-02-27 MED ORDER — KETOROLAC TROMETHAMINE 15 MG/ML IJ SOLN: 15.0000 mg | Freq: Once | INTRAMUSCULAR | Status: DC

## 2018-02-27 NOTE — ED Provider Notes (Addendum)
[facility] EMERGENCY DEPARTMENT Provider Note   CSN: [id number] Arrival date & time: 02/27/18  1802     History   Chief Complaint Chief Complaint  Patient presents with  . Sore Throat    HPI Tom Miranda is a 42 y.o. male with a past medical history significant for recurrent strep pharyngitis who presents for evaluation of sore throat.  Patient states he has had a sore throat for the last 2 days.  Patient states he has not wanted to eat solid foods because it hurts to swallow.  Patient states he is also had bilateral ear pain x2 days.  No tinnitus, decreased hearing or ear discharge.  States his ears only hurts when he swallows.  Rates his throat pain a 4/10.  Pain does not radiate. States he has had a slight nonproductive cough.  Patient states he coughs approximately 2-3 times a day.  Denies fever, chills, nausea, vomiting, headache, vision changes, congestion, rhinorrhea, neck pain, neck stiffness, abdominal pain or diarrhea.  Has not taken anything for his pain.  Denies drooling or difficulty tolerating oral secretions.  History obtained from patient.  No interpreter was used.  HPI  Past Medical History:  Diagnosis Date  . Asthma   . GERD (gastroesophageal reflux disease)     There are no active problems to display for this patient.   History reviewed. No pertinent surgical history.      Home Medications    Prior to Admission medications   Medication Sig Start Date End Date Taking? Authorizing Provider  amoxicillin (AMOXIL) 500 MG capsule Take 1 capsule (500 mg total) by mouth 2 (two) times daily. 04/26/17   Petrucelli, Samantha R, PA-C  ibuprofen (ADVIL,MOTRIN) 600 MG tablet Take 1 tablet (600 mg total) by mouth every 8 (eight) hours as needed. 04/16/17   Azalia Bilis, MD  naproxen (NAPROSYN) 500 MG tablet Take 1 tablet (500 mg total) by mouth 2 (two) times daily. 05/14/17   Dietrich Pates, PA-C    Family History History reviewed. No pertinent  family history.  Social History Social History   Tobacco Use  . Smoking status: Never Smoker  . Smokeless tobacco: Never Used  Substance Use Topics  . Alcohol use: Not Currently  . Drug use: No     Allergies   Patient has no known allergies.   Review of Systems Review of Systems  Constitutional: Negative.   HENT: Positive for ear pain and sore throat. Negative for congestion, drooling, ear discharge, facial swelling, hearing loss, mouth sores, nosebleeds, postnasal drip, rhinorrhea, sinus pressure, sinus pain, sneezing, tinnitus, trouble swallowing and voice change.   Respiratory: Negative.   Cardiovascular: Negative.   Gastrointestinal: Negative.   Genitourinary: Negative.   Musculoskeletal: Negative.   Skin: Negative.   All other systems reviewed and are negative.    Physical Exam Updated Vital Signs BP (!) 133/45 (BP Location: Right Arm)   Pulse 78   Temp 98.6 F (37 C) (Oral)   Resp 16   Ht 5\' 8"  (1.727 m)   Wt 92.1 kg   SpO2 97%   BMI 30.87 kg/m   Physical Exam  Constitutional: Vital signs are normal. He appears well-developed and well-nourished.  Non-toxic appearance. He does not have a sickly appearance. He does not appear ill. No distress.  HENT:  Head: Normocephalic and atraumatic.  Right Ear: Hearing, external ear and ear canal normal. Tympanic membrane is not injected, not scarred, not perforated, not erythematous, not retracted and not bulging.  Left Ear: Hearing, tympanic membrane, external ear and ear canal normal. Tympanic membrane is not injected, not scarred, not perforated, not erythematous, not retracted and not bulging.  Nose: Nose normal. No mucosal edema, rhinorrhea, sinus tenderness, nasal deformity or nasal septal hematoma. No epistaxis.  No foreign bodies. Right sinus exhibits no maxillary sinus tenderness and no frontal sinus tenderness. Left sinus exhibits no maxillary sinus tenderness and no frontal sinus tenderness.  Mouth/Throat: Uvula  is midline and mucous membranes are normal. No oral lesions. No trismus in the jaw. No dental abscesses, uvula swelling or lacerations. Posterior oropharyngeal erythema present. No oropharyngeal exudate, posterior oropharyngeal edema or tonsillar abscesses. Tonsils are 2+ on the right. Tonsils are 2+ on the left. Tonsillar exudate.  Uvula midline without edema.  No evidence of trismus.  No oral lesions or oropharyngeal abscess.  Posterior oropharynx with erythema, without edema or exudate.  No evidence of tonsillar abscess.  Tonsils are 2+ bilaterally with no obvious tonsillar exudate.  Phonation normal.  No elevation of palate or palate swelling.  Eyes: Pupils are equal, round, and reactive to light. Conjunctivae, EOM and lids are normal.  Neck: Trachea normal, normal range of motion, full passive range of motion without pain and phonation normal. Neck supple. No neck rigidity. No edema, no erythema and normal range of motion present.  Unable to speak in full sentences without difficulty.  Neck with full range of motion without pain.  No neck rigidity.  Cardiovascular: Normal rate, regular rhythm, normal heart sounds, intact distal pulses and normal pulses.  Pulmonary/Chest: Effort normal and breath sounds normal. No accessory muscle usage or stridor. No tachypnea. No respiratory distress. He has no decreased breath sounds. He has no wheezes. He has no rhonchi. He has no rales. He exhibits no tenderness, no crepitus and no edema.  Lungs clear to auscultation bilaterally.  Abdominal: Soft. Normal appearance. He exhibits no distension. There is no tenderness. There is no rigidity, no rebound, no guarding, no CVA tenderness, no tenderness at McBurney's point and negative Murphy's sign.  Musculoskeletal: Normal range of motion.  Neurological: He is alert.  Skin: Skin is warm and dry. He is not diaphoretic.  No Edema, erythema, ecchymosis or warmth.  Psychiatric: He has a normal mood and affect.  Nursing  note and vitals reviewed.    ED Treatments / Results  Labs (all labs ordered are listed, but only abnormal results are displayed) Labs Reviewed  GROUP A STREP BY PCR    EKG None  Radiology No results found.  Procedures Procedures (including critical care time)  Medications Ordered in ED Medications  ketorolac (TORADOL) 15 MG/ML injection 15 mg (15 mg Intramuscular Refused 02/27/18 1851)     Initial Impression / Assessment and Plan / ED Course  I have reviewed the triage vital signs and the nursing notes.  Pertinent labs & imaging results that were available during my care of the patient were reviewed by me and considered in my medical decision making (see chart for details).  42 year old male who appears otherwise well presents for evaluation of 2 days of sore throat.  History of recurrent strep pharyngitis.  Afebrile, nonseptic, non-ill-appearing.  Posterior oropharynx erythematous without edema or exudate.  Tonsils 2+ bilaterally with erythema.  No evidence of peritonsillar abscess.  Uvula midline without deviation or edema.  No drooling or difficulty tolerating oral secretions.  Phonation normal.  Neck without stiffness or rigidity.  Neck without submandibular swelling.  Low suspicion for PTA, RPA, Ludwig's angina or deep  space infection.  Able to tolerate p.o. intake in department. Strep negative.  Patient refused Toradol.  Discussed with patient symptomatic management.  Discussed this is most likely viral pharyngitis. No abx indicated. Discharged with symptomatic tx for pain  Pt does not appear dehydrated, but did discuss importance of water rehydration. Presentation non concerning for PTA or RPA. No trismus or uvula deviation. Specific return precautions discussed.  Is hemodynamically stable and appropriate for DC home at this time.  Recommended PCP follow up with strict return precautions to the ED.  Patient voiced understanding and is agreeable for follow-up.    Final  Clinical Impressions(s) / ED Diagnoses   Final diagnoses:  Viral pharyngitis    ED Discharge Orders    None       Farron Watrous A, PA-C 02/27/18 1930    Alfonso Carden A, PA-C 02/27/18 1954    Pricilla LovelessGoldston, Scott, MD 03/02/18 [phone]

## 2018-02-27 NOTE — ED Triage Notes (Signed)
Pt from home with a c/o a sore throat and bilateral ear pain x 2 days. Ear pain is present only when he swallows. Additional complaints of a slight cough that is non-productive. No congestion or runny nose. No N/V/D. No fever or chills.

## 2018-02-27 NOTE — ED Notes (Signed)
Patient verbalizes understanding of discharge instructions. Opportunity for questioning and answers were provided. Armband removed by staff, pt discharged from ED.  

## 2018-02-27 NOTE — Discharge Instructions (Signed)
Reevaluated today for sore throat.  This is most likely viral in nature as her strep test was negative.  I would recommend Tylenol and ibuprofen for pain.  You may also get over-the-counter Chloraseptic spray to help with your painful throat.  I recommend increasing your water intake.  Follow-up with your PCP for reevaluation if her symptoms do not resolve.  Return to the ED for any worsening symptoms.

## 2018-03-16 ENCOUNTER — Ambulatory Visit (HOSPITAL_COMMUNITY)
Admission: EM | Admit: 2018-03-16 | Discharge: 2018-03-16 | Disposition: A | Discharge status: Home / Self Care | Payer: Medicaid Other | Attending: Family Medicine | Admitting: Family Medicine

## 2018-03-16 ENCOUNTER — Encounter (HOSPITAL_COMMUNITY): Payer: Self-pay | Admitting: Emergency Medicine

## 2018-03-16 ENCOUNTER — Other Ambulatory Visit: Payer: Self-pay

## 2018-03-16 DIAGNOSIS — Z791 Long term (current) use of non-steroidal anti-inflammatories (NSAID): Secondary | ICD-10-CM | POA: Diagnosis not present

## 2018-03-16 DIAGNOSIS — J069 Acute upper respiratory infection, unspecified: Secondary | ICD-10-CM | POA: Insufficient documentation

## 2018-03-16 DIAGNOSIS — B9789 Other viral agents as the cause of diseases classified elsewhere: Secondary | ICD-10-CM

## 2018-03-16 LAB — POCT RAPID STREP A: Streptococcus, Group A Screen (Direct): NEGATIVE

## 2018-03-16 MED ORDER — DM-GUAIFENESIN ER 30-600 MG PO TB12: 1.0000 | ORAL_TABLET | Freq: Two times a day (BID) | ORAL | 0 refills | Status: DC

## 2018-03-16 NOTE — ED Provider Notes (Signed)
[facility]    CSN: [id number] Arrival date & time: 03/16/18  1456     History   Chief Complaint Chief Complaint  Patient presents with  . URI    HPI Tom Miranda is a 42 y.o. male.   Patient is a 42 year old male presents with cough, congestion, body aches, sore throat.  This is been present and constant for 3 days.  No known fever.  He has been taking TheraFlu, Zyrtec for his symptoms.  He has multiple sick contacts.  They have similar symptoms.   ROS per HPI      Past Medical History:  Diagnosis Date  . Asthma   . GERD (gastroesophageal reflux disease)     There are no active problems to display for this patient.   History reviewed. No pertinent surgical history.     Home Medications    Prior to Admission medications   Medication Sig Start Date End Date Taking? Authorizing Provider  ibuprofen (ADVIL,MOTRIN) 600 MG tablet Take 1 tablet (600 mg total) by mouth every 8 (eight) hours as needed. 04/16/17  Yes Azalia Bilis, MD  dextromethorphan-guaiFENesin [facility] DM) 30-600 MG 12hr tablet Take 1 tablet by mouth 2 (two) times daily. 03/16/18   Dahlia Byes A, NP  naproxen (NAPROSYN) 500 MG tablet Take 1 tablet (500 mg total) by mouth 2 (two) times daily. 05/14/17   Dietrich Pates, PA-C    Family History History reviewed. No pertinent family history.  Social History Social History   Tobacco Use  . Smoking status: Never Smoker  . Smokeless tobacco: Never Used  Substance Use Topics  . Alcohol use: Not Currently  . Drug use: No     Allergies   Patient has no known allergies.   Review of Systems Review of Systems   Physical Exam Triage Vital Signs ED Triage Vitals [03/16/18 1616]  Enc Vitals Group     BP 110/62     Pulse Rate 84     Resp      Temp 98 F (36.7 C)     Temp Source Oral     SpO2 100 %     Weight      Height      Head Circumference      Peak Flow      Pain Score 5     Pain Loc      Pain Edu?      Excl. in GC?     No data found.  Updated Vital Signs BP 110/62 (BP Location: Left Arm)   Pulse 84   Temp 98 F (36.7 C) (Oral)   SpO2 100%   Visual Acuity Right Eye Distance:   Left Eye Distance:   Bilateral Distance:    Right Eye Near:   Left Eye Near:    Bilateral Near:     Physical Exam  Constitutional: He appears well-developed and well-nourished.  HENT:  Head: Normocephalic and atraumatic.  Right Ear: Hearing, external ear and ear canal normal. A middle ear effusion is present.  Left Ear: Hearing, tympanic membrane, external ear and ear canal normal.  Nose: Mucosal edema and rhinorrhea present.  Mouth/Throat: Uvula is midline and mucous membranes are normal. No trismus in the jaw. No uvula swelling. Posterior oropharyngeal erythema present. Tonsils are 1+ on the right. Tonsils are 1+ on the left.  Eyes: Conjunctivae are normal.  Neck: Normal range of motion.  Cardiovascular: Normal rate, regular rhythm and normal heart sounds.  Pulmonary/Chest: Effort normal and  breath sounds normal.  Musculoskeletal: Normal range of motion.  Neurological: He is alert.  Skin: Skin is warm and dry.  Psychiatric: He has a normal mood and affect.  Nursing note and vitals reviewed.    UC Treatments / Results  Labs (all labs ordered are listed, but only abnormal results are displayed) Labs Reviewed  CULTURE, GROUP A STREP [facility]  POCT RAPID STREP A    EKG None  Radiology No results found.  Procedures Procedures (including critical care time)  Medications Ordered in UC Medications - No data to display  Initial Impression / Assessment and Plan / UC Course  I have reviewed the triage vital signs and the nursing notes.  Pertinent labs & imaging results that were available during my care of the patient were reviewed by me and considered in my medical decision making (see chart for details).     Rapid strep negative Most likely viral URI Mucinex DM for symptoms Follow up as needed for  continued or worsening symptoms  Final Clinical Impressions(s) / UC Diagnoses   Final diagnoses:  Viral URI with cough     Discharge Instructions     Rapid strep test was negative This is most likely a viral illness  Mucinex DM could help with your symptoms Tylenol/ibuprofen for pain Follow up as needed for continued or worsening symptoms     ED Prescriptions    Medication Sig Dispense Auth. Provider   dextromethorphan-guaiFENesin (MUCINEX DM) 30-600 MG 12hr tablet Take 1 tablet by mouth 2 (two) times daily. 15 tablet Dahlia ByesBast, Nashay Brickley A, NP     Controlled Substance Prescriptions [place] Controlled Substance Registry consulted? Not Applicable   Janace ArisBast, Osiris Charles A, NP 03/16/18 1722

## 2018-03-16 NOTE — ED Triage Notes (Addendum)
Pt reports nasal congestion, cough and body aches x3 days.  Pt has been taking Theraflu, Zyrtec, and Ibuprofen with no relief.

## 2018-03-16 NOTE — Discharge Instructions (Addendum)
Rapid strep test was negative This is most likely a viral illness  Mucinex DM could help with your symptoms Tylenol/ibuprofen for pain Follow up as needed for continued or worsening symptoms

## 2018-03-19 LAB — CULTURE, GROUP A STREP (THRC)

## 2019-06-02 ENCOUNTER — Other Ambulatory Visit (HOSPITAL_COMMUNITY)
Admission: RE | Admit: 2019-06-02 | Discharge: 2019-06-02 | Disposition: A | Discharge status: Home / Self Care | Payer: Medicaid Other | Source: Ambulatory Visit | Attending: Family Medicine | Admitting: Family Medicine

## 2019-06-02 ENCOUNTER — Encounter: Payer: Self-pay | Admitting: Family Medicine

## 2019-06-02 ENCOUNTER — Other Ambulatory Visit: Payer: Self-pay

## 2019-06-02 ENCOUNTER — Ambulatory Visit (INDEPENDENT_AMBULATORY_CARE_PROVIDER_SITE_OTHER): Payer: Medicaid Other | Admitting: Family Medicine

## 2019-06-02 DIAGNOSIS — E669 Obesity, unspecified: Secondary | ICD-10-CM

## 2019-06-02 DIAGNOSIS — Z7689 Persons encountering health services in other specified circumstances: Secondary | ICD-10-CM | POA: Insufficient documentation

## 2019-06-02 DIAGNOSIS — H409 Unspecified glaucoma: Secondary | ICD-10-CM | POA: Diagnosis not present

## 2019-06-02 DIAGNOSIS — S30812A Abrasion of penis, initial encounter: Secondary | ICD-10-CM | POA: Diagnosis not present

## 2019-06-02 NOTE — Assessment & Plan Note (Signed)
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  He reports a diagnosis occurred 10 to 12 years ago.  He was initially given eyedrops but has not used those in many years.  He reports that his vision is slowly gotten worse especially at distances.  Vision check today showed 20/25 in the right eye and 20/20 in both eyes. -Ambulatory referral to ophthalmology further evaluation

## 2019-06-02 NOTE — Progress Notes (Signed)
   CHIEF COMPLAINT / HPI: New patient visit  Current concerns include sight. Issues vision.  Patient reports blurry vision.  He says that he was diagnosed with glaucoma 10 to 12 years ago feels like his distance vision has gotten worse slowly.  Was given eyedrops but hasnt used that in years.    PMH:  Glaucoma in both eyes Reflux- took prilosec for a short period. happens every time he eats pizza.   PSH: None   Family  Mother- glaucoma   Allergies: NKDA  Social  Owns a Artist.  Lives with roommate, exercise every day. No tobacco products. EtOH once a month. Denies other illicit drug use. Sexually active with women, does not regularly use protection. No hx of STD.  Patient reports that he was sexually active 2 days before Valentine's Day and during intercourse had an abrasion with a piercing from his partner on the shaft and head of his penis.  He reports that this injury occurred during foreplay.  OBJECTIVE: BP 110/70   Pulse (!) 48   Ht 5\' 8"  (1.727 m)   Wt 216 lb 3.2 oz (98.1 kg)   SpO2 100%   BMI 32.87 kg/m   General: NAD HEENT: Atraumatic. Normocephalic. Normal oropharynx without erythema, lesions, exudate.  Neck: No cervical lymphadenopathy.  Cardiac: RRR, no m/r/g Respiratory: CTAB, normal work of breathing Abdomen: soft, nontender, nondistended, bowel sounds normal General: Patient has healing abrasion to the shaft of his penis that connects with the head of his penis.  No erythema noted no pustules noted.  Patient reports that it appears to be healing well. Skin: warm and dry, no rashes noted Neuro: alert and oriented  ASSESSMENT / PLAN:  Establishing care with new doctor, encounter for New patient encounter.  Patient reports he is sexually active and does not use protection.  Patient is obese with a BMI of 32.7. -CMP -Hemoglobin A1c -Urine cytology gonorrhea chlamydia -RPR and HIV -Lipid panel  Glaucoma Patient reports that he has a previous diagnosis  of glaucoma in both eyes.  He reports a diagnosis occurred 10 to 12 years ago.  He was initially given eyedrops but has not used those in many years.  He reports that his vision is slowly gotten worse especially at distances.  Vision check today showed 20/25 in the right eye and 20/20 in both eyes. -Ambulatory referral to ophthalmology further evaluation  Penile abrasion, initial encounter Patient reports that he was having intercourse with his girlfriend approximately 5 days ago and that during foreplay he got an abrasion to his penis on his partners new piercing.  He denies any pustules, discharge, hematuria, dysuria. -Penile exam showed healing abrasion to penile shaft as well as the head of the penis.  No erythema or pustules noted -Given story and source of injury this is most likely a simple abrasion -Monitor for return of symptoms and consider possible herpes infection if future outbreaks occur   , MD [facility]

## 2019-06-02 NOTE — Assessment & Plan Note (Signed)
New patient encounter.  Patient reports he is sexually active and does not use protection.  Patient is obese with a BMI of 32.7. -CMP -Hemoglobin A1c -Urine cytology gonorrhea chlamydia -RPR and HIV -Lipid panel

## 2019-06-02 NOTE — Patient Instructions (Addendum)
It was a pleasure to meet you today.  For your call, and we can refer you to ophthalmology for evaluation.  Because you are a new patient given your age therapy other lab tests that we are going to order.  We also discussed STD check which I have ordered.  We will collect some urine and blood for me for that.  The results for all your tests will come back and you can access them through your MyChart.  I will send you the link for how to do that.  If any of your results come back abnormal I will call you with those results.  If they are all normal or negative I will not call you.  If you have any questions or concerns please reach out to our clinic.

## 2019-06-02 NOTE — Assessment & Plan Note (Signed)
Patient reports that he was having intercourse with his girlfriend approximately 5 days ago and that during foreplay he got an abrasion to his penis on his partners new piercing.  He denies any pustules, discharge, hematuria, dysuria. -Penile exam showed healing abrasion to penile shaft as well as the head of the penis.  No erythema or pustules noted -Given story and source of injury this is most likely a simple abrasion -Monitor for return of symptoms and consider possible herpes infection if future outbreaks occur

## 2019-06-03 LAB — URINE CYTOLOGY ANCILLARY ONLY
Chlamydia: NEGATIVE
Comment: NEGATIVE
Comment: NORMAL
Neisseria Gonorrhea: NEGATIVE

## 2019-06-09 ENCOUNTER — Other Ambulatory Visit: Payer: Self-pay | Admitting: Family Medicine

## 2019-06-09 ENCOUNTER — Other Ambulatory Visit: Payer: Medicaid Other | Admitting: Family Medicine

## 2019-06-09 ENCOUNTER — Other Ambulatory Visit: Payer: Medicaid Other

## 2019-06-09 ENCOUNTER — Other Ambulatory Visit: Payer: Self-pay

## 2019-06-09 DIAGNOSIS — E669 Obesity, unspecified: Secondary | ICD-10-CM

## 2019-06-09 DIAGNOSIS — Z7689 Persons encountering health services in other specified circumstances: Secondary | ICD-10-CM

## 2019-06-10 ENCOUNTER — Telehealth: Payer: Self-pay | Admitting: Family Medicine

## 2019-06-10 LAB — HIV ANTIBODY (ROUTINE TESTING W REFLEX): HIV Screen 4th Generation wRfx: NONREACTIVE

## 2019-06-10 LAB — COMPREHENSIVE METABOLIC PANEL
ALT: 14 IU/L (ref 0–44)
AST: 18 IU/L (ref 0–40)
Albumin/Globulin Ratio: 1.5 (ref 1.2–2.2)
Albumin: 4.5 g/dL (ref 4.0–5.0)
Alkaline Phosphatase: 58 IU/L (ref 39–117)
BUN/Creatinine Ratio: 9 (ref 9–20)
BUN: 10 mg/dL (ref 6–24)
Bilirubin Total: 0.4 mg/dL (ref 0.0–1.2)
CO2: 22 mmol/L (ref 20–29)
Calcium: 9.6 mg/dL (ref 8.7–10.2)
Chloride: 100 mmol/L (ref 96–106)
Creatinine, Ser: 1.11 mg/dL (ref 0.76–1.27)
GFR calc Af Amer: 95 mL/min/{1.73_m2} (ref >59)
GFR calc non Af Amer: 82 mL/min/{1.73_m2} (ref >59)
Globulin, Total: 3.1 g/dL (ref 1.5–4.5)
Glucose: 72 mg/dL (ref 65–99)
Potassium: 4 mmol/L (ref 3.5–5.2)
Sodium: 138 mmol/L (ref 134–144)
Total Protein: 7.6 g/dL (ref 6.0–8.5)

## 2019-06-10 LAB — LIPID PANEL
Chol/HDL Ratio: 5.1 ratio — ABNORMAL HIGH (ref 0.0–5.0)
Cholesterol, Total: 214 mg/dL — ABNORMAL HIGH (ref 100–199)
HDL: 42 mg/dL (ref >39)
LDL Chol Calc (NIH): 153 mg/dL — ABNORMAL HIGH (ref 0–99)
Triglycerides: 103 mg/dL (ref 0–149)
VLDL Cholesterol Cal: 19 mg/dL (ref 5–40)

## 2019-06-10 LAB — HEMOGLOBIN A1C
Est. average glucose Bld gHb Est-mCnc: 126 mg/dL
Hgb A1c MFr Bld: 6 % — ABNORMAL HIGH (ref 4.8–5.6)

## 2019-06-10 LAB — RPR: RPR Ser Ql: NONREACTIVE

## 2019-06-10 NOTE — Telephone Encounter (Signed)
Spoke with patient regarding blood work. Informed him that his HA1C falls in the prediabetic range and that his cholesterol levels where also elevated but not to the level that he needs medication at this point. Recommended dietary changes and repeat labs next year. Patient voices understanding.

## 2019-06-16 ENCOUNTER — Ambulatory Visit (INDEPENDENT_AMBULATORY_CARE_PROVIDER_SITE_OTHER): Payer: Medicaid Other | Admitting: Family Medicine

## 2019-06-16 ENCOUNTER — Encounter: Payer: Self-pay | Admitting: Family Medicine

## 2019-06-16 ENCOUNTER — Other Ambulatory Visit: Payer: Self-pay

## 2019-06-16 DIAGNOSIS — L918 Other hypertrophic disorders of the skin: Secondary | ICD-10-CM | POA: Diagnosis not present

## 2019-06-16 NOTE — Progress Notes (Signed)
    SUBJECTIVE:   CHIEF COMPLAINT / HPI:  Skin tag removal Patient reports for skin tag removal.  Reports that it has been there for "a while".  He says that it has been around for at least 6 years but maybe longer.  He has never had any infections or inflammation in the skin tag.  He has no other concerns at this time  OBJECTIVE:   BP 122/60   Pulse (!) 51   Wt 217 lb 6.4 oz (98.6 kg)   SpO2 98%   BMI 33.06 kg/m   General: No acute distress, sitting comfortably on exam table Derm: Patient has approximately 1 cm long acrochordae in right axilla.  Nonirritated, nonerythematous, nonedematous  Procedure: Skin tag removal Informed consent:  Discussed risks (permanent scarring, infection, pain, bleeding, bruising, redness, and recurrence of the lesion) and benefits of the procedure, as well as the alternatives.  He is aware that skin tags are benign lesions, and their removal is often not considered medically necessary.  Informed consent was obtained. Anesthesia: 1% lidocaine with epi The area was prepared and draped in a standard fashion. Snip removal was performed.   Sterile dressing were applied.   The patient tolerated procedure well. The patient was instructed on post-op care.   Number of lesions removed:  1  ASSESSMENT/PLAN:   Skin tag Patient presents for removal of skin tag and axilla.  It has been there for greater than 6 years.  It is mildly irritating to the patient and he wishes for it to be removed. -Skin tag excised -Patient given return precautions and signs for infection  Derrel Nip, MD [facility]

## 2019-06-16 NOTE — Patient Instructions (Signed)
You were seen today for skin tag removal.  We used a small amount of lidocaine to numb your underarm and a scalpel to cut the skin tag off.  You may notice a small amount of bleeding and oozing from the site but that should stop relatively soon.  If you notice any swelling, redness, increasing pain please seek medical attention.  It was a pleasure to see you today and have a wonderful afternoon.  Skin Tag Removal Wound Care instructions  . Sometimes bandages are not necessary.  If a bandage is used, leave the original bandage on for 24 hours if possible.  If the bandage becomes soaked or soiled before that time, it is OK to remove it and examine the wound.  A small amount of post-operative bleeding is normal.  If excessive bleeding occurs, remove the bandage, place gauze over the site and apply continuous pressure (no peeking) over the area for 20-30 minutes.  If this does not stop the bleeding, try again for 40 minutes.  If this does not work, please call our clinic as soon as possible (even if after hours).    . Once a day, cleanse the wound with soap and water.  If a thick crust develops you may use a Q-tip dipped into dilute hydrogen peroxide (mix 1:1 with water) to dissolve it.  Hydrogen peroxide can slow the healing process, so use it only as needed.  After washing, apply Vaseline jelly or Polysporin ointment.  For best healing, the wound should be covered with a layer of ointment at all times.  This may mean re-applying the ointment several times a day.  For open wounds, continue until it has healed.    . If you have any swelling, keep the area elevated.  . Some redness, tenderness and white or yellow material in the wound is normal healing.  If the area becomes very sore and red, or develops a thick yellow-green material (pus), it may be infected; please notify us.    . Wound healing continues for up to one year following surgery.  It is not unusual to experience pain in the scar from time to  time during the interval.  If the pain becomes severe or the scar thickens, you should notify the office.  A slight amount of redness in a scar is expected for the first six months.  After six months, the redness subsides and the scar will soften and fade.  The color difference becomes less noticeable with time.  If there are any problems, return for a post-op surgery check at your earliest convenience.  . It is important to wear sunscreen daily with an SPF of 30 or higher over the treated sites and to wear sun-protective clothing.  . Please call our office for any questions or concerns.

## 2019-06-16 NOTE — Assessment & Plan Note (Signed)
Patient presents for removal of skin tag and axilla.  It has been there for greater than 6 years.  It is mildly irritating to the patient and he wishes for it to be removed. -Skin tag excised -Patient given return precautions and signs for infection

## 2019-07-22 ENCOUNTER — Emergency Department (HOSPITAL_COMMUNITY)
Admission: EM | Admit: 2019-07-22 | Discharge: 2019-07-23 | Disposition: A | Discharge status: Home / Self Care | Payer: Medicaid Other | Attending: Emergency Medicine | Admitting: Emergency Medicine

## 2019-07-22 ENCOUNTER — Other Ambulatory Visit: Payer: Self-pay

## 2019-07-22 ENCOUNTER — Encounter (HOSPITAL_COMMUNITY): Payer: Self-pay

## 2019-07-22 DIAGNOSIS — Z5321 Procedure and treatment not carried out due to patient leaving prior to being seen by health care provider: Secondary | ICD-10-CM | POA: Insufficient documentation

## 2019-07-22 DIAGNOSIS — R112 Nausea with vomiting, unspecified: Secondary | ICD-10-CM | POA: Insufficient documentation

## 2019-07-22 LAB — CBC
HCT: 42.8 % (ref 39.0–52.0)
Hemoglobin: 14.2 g/dL (ref 13.0–17.0)
MCH: 29 pg (ref 26.0–34.0)
MCHC: 33.2 g/dL (ref 30.0–36.0)
MCV: 87.5 fL (ref 80.0–100.0)
Platelets: 266 10*3/uL (ref 150–400)
RBC: 4.89 MIL/uL (ref 4.22–5.81)
RDW: 13.5 % (ref 11.5–15.5)
WBC: 11.9 10*3/uL — ABNORMAL HIGH (ref 4.0–10.5)
nRBC: 0 % (ref 0.0–0.2)

## 2019-07-22 LAB — COMPREHENSIVE METABOLIC PANEL
ALT: 21 U/L (ref 0–44)
AST: 33 U/L (ref 15–41)
Albumin: 4.4 g/dL (ref 3.5–5.0)
Alkaline Phosphatase: 46 U/L (ref 38–126)
Anion gap: 17 — ABNORMAL HIGH (ref 5–15)
BUN: 13 mg/dL (ref 6–20)
CO2: 20 mmol/L — ABNORMAL LOW (ref 22–32)
Calcium: 9.5 mg/dL (ref 8.9–10.3)
Chloride: 103 mmol/L (ref 98–111)
Creatinine, Ser: 1.6 mg/dL — ABNORMAL HIGH (ref 0.61–1.24)
GFR calc Af Amer: >60 mL/min (ref >60)
GFR calc non Af Amer: 53 mL/min — ABNORMAL LOW (ref >60)
Glucose, Bld: 159 mg/dL — ABNORMAL HIGH (ref 70–99)
Potassium: 2.7 mmol/L — CL (ref 3.5–5.1)
Sodium: 140 mmol/L (ref 135–145)
Total Bilirubin: 0.7 mg/dL (ref 0.3–1.2)
Total Protein: 7.4 g/dL (ref 6.5–8.1)

## 2019-07-22 LAB — LIPASE, BLOOD: Lipase: 23 U/L (ref 11–51)

## 2019-07-22 MED ORDER — ONDANSETRON 4 MG PO TBDP
4.0000 mg | ORAL_TABLET | Freq: Once | ORAL | Status: AC | PRN
  Administered 2019-07-22: 20:00:00 4 mg via ORAL
  Filled 2019-07-22: qty 1

## 2019-07-22 MED ORDER — SODIUM CHLORIDE 0.9% FLUSH: 3.0000 mL | Freq: Once | INTRAVENOUS | Status: DC

## 2019-07-22 NOTE — ED Triage Notes (Addendum)
Pt arrives to ED w/ c/o n/v after eating hibachi.

## 2019-07-22 NOTE — ED Notes (Signed)
Pt sitting outside with sister and having multiple episodes of emesis. Pt stating it could be the food that he ate for dinner. Tech gave pt multiple emesis bags.

## 2019-07-22 NOTE — Progress Notes (Signed)
Critical K 2.7 

## 2019-07-23 NOTE — ED Notes (Signed)
Called pt for VS recheck x3, no response.

## 2019-08-18 ENCOUNTER — Other Ambulatory Visit: Payer: Self-pay

## 2019-08-18 ENCOUNTER — Ambulatory Visit (HOSPITAL_COMMUNITY)
Admission: RE | Admit: 2019-08-18 | Discharge: 2019-08-18 | Disposition: A | Discharge status: Home / Self Care | Payer: Medicaid Other | Source: Ambulatory Visit | Attending: Family Medicine | Admitting: Family Medicine

## 2019-08-18 ENCOUNTER — Emergency Department (HOSPITAL_COMMUNITY): Payer: Medicaid Other

## 2019-08-18 ENCOUNTER — Ambulatory Visit (INDEPENDENT_AMBULATORY_CARE_PROVIDER_SITE_OTHER): Payer: Medicaid Other | Admitting: Family Medicine

## 2019-08-18 ENCOUNTER — Encounter (HOSPITAL_COMMUNITY): Payer: Self-pay | Admitting: Emergency Medicine

## 2019-08-18 ENCOUNTER — Emergency Department (HOSPITAL_COMMUNITY)
Admission: EM | Admit: 2019-08-18 | Discharge: 2019-08-18 | Disposition: A | Discharge status: Home / Self Care | Payer: Medicaid Other | Attending: Emergency Medicine | Admitting: Emergency Medicine

## 2019-08-18 ENCOUNTER — Telehealth: Payer: Self-pay

## 2019-08-18 VITALS — BP 118/82 | HR 89 | Ht 68.0 in | Wt 214.0 lb

## 2019-08-18 DIAGNOSIS — R0789 Other chest pain: Secondary | ICD-10-CM | POA: Diagnosis not present

## 2019-08-18 DIAGNOSIS — I493 Ventricular premature depolarization: Secondary | ICD-10-CM | POA: Diagnosis not present

## 2019-08-18 DIAGNOSIS — R079 Chest pain, unspecified: Secondary | ICD-10-CM | POA: Insufficient documentation

## 2019-08-18 DIAGNOSIS — J45909 Unspecified asthma, uncomplicated: Secondary | ICD-10-CM | POA: Insufficient documentation

## 2019-08-18 LAB — BASIC METABOLIC PANEL
Anion gap: 10 (ref 5–15)
BUN: 9 mg/dL (ref 6–20)
CO2: 26 mmol/L (ref 22–32)
Calcium: 9.6 mg/dL (ref 8.9–10.3)
Chloride: 101 mmol/L (ref 98–111)
Creatinine, Ser: 1.2 mg/dL (ref 0.61–1.24)
GFR calc Af Amer: >60 mL/min (ref >60)
GFR calc non Af Amer: >60 mL/min (ref >60)
Glucose, Bld: 97 mg/dL (ref 70–99)
Potassium: 4.1 mmol/L (ref 3.5–5.1)
Sodium: 137 mmol/L (ref 135–145)

## 2019-08-18 LAB — CBC
HCT: 46.7 % (ref 39.0–52.0)
Hemoglobin: 15.1 g/dL (ref 13.0–17.0)
MCH: 28.9 pg (ref 26.0–34.0)
MCHC: 32.3 g/dL (ref 30.0–36.0)
MCV: 89.3 fL (ref 80.0–100.0)
Platelets: 283 10*3/uL (ref 150–400)
RBC: 5.23 MIL/uL (ref 4.22–5.81)
RDW: 13.3 % (ref 11.5–15.5)
WBC: 6.8 10*3/uL (ref 4.0–10.5)
nRBC: 0 % (ref 0.0–0.2)

## 2019-08-18 LAB — HEPATIC FUNCTION PANEL
ALT: 30 U/L (ref 0–44)
AST: 42 U/L — ABNORMAL HIGH (ref 15–41)
Albumin: 4.3 g/dL (ref 3.5–5.0)
Alkaline Phosphatase: 58 U/L (ref 38–126)
Bilirubin, Direct: <0.1 mg/dL (ref 0.0–0.2)
Total Bilirubin: 0.7 mg/dL (ref 0.3–1.2)
Total Protein: 7.6 g/dL (ref 6.5–8.1)

## 2019-08-18 LAB — TROPONIN I (HIGH SENSITIVITY)
Troponin I (High Sensitivity): 10 ng/L (ref <18)
Troponin I (High Sensitivity): 6 ng/L (ref <18)

## 2019-08-18 LAB — MAGNESIUM: Magnesium: 1.9 mg/dL (ref 1.7–2.4)

## 2019-08-18 LAB — TSH: TSH: 0.984 u[IU]/mL (ref 0.350–4.500)

## 2019-08-18 MED ORDER — KETOROLAC TROMETHAMINE 30 MG/ML IJ SOLN
30.0000 mg | Freq: Once | INTRAMUSCULAR | Status: DC
  Filled 2019-08-18: qty 1

## 2019-08-18 MED ORDER — FAMOTIDINE 40 MG PO TABS: 40.0000 mg | ORAL_TABLET | Freq: Every day | ORAL | 0 refills | Status: DC

## 2019-08-18 MED ORDER — LIDOCAINE VISCOUS HCL 2 % MT SOLN
15.0000 mL | Freq: Once | OROMUCOSAL | Status: AC
  Administered 2019-08-18: 15 mL via ORAL

## 2019-08-18 MED ORDER — ALUM & MAG HYDROXIDE-SIMETH 200-200-20 MG/5ML PO SUSP
30.0000 mL | Freq: Once | ORAL | Status: AC
  Administered 2019-08-18: 30 mL via ORAL

## 2019-08-18 MED ORDER — FAMOTIDINE 40 MG PO TABS: 40.0000 mg | ORAL_TABLET | Freq: Every day | ORAL | 1 refills | Status: DC | PRN

## 2019-08-18 NOTE — Patient Instructions (Addendum)
Thank you for coming to see me today. It was a pleasure! Today we talked about:   We will be able to release your results on MyChart.  If there is anything abnormal then we will call you with these results.  I have also sent Pepcid to your pharmacy.  You can take this as needed for your heartburn.  If you develop any worsening chest pain, shortness of breath and do not hesitate to go back to the emergency room.  Please follow-up as needed.  If you have any questions or concerns, please do not hesitate to call the office at [phone].  Take Care,   [place] Ahmyah Gidley, DO

## 2019-08-18 NOTE — Assessment & Plan Note (Addendum)
Patient with history of indigestion.  Usually takes Alka-Seltzer and Tums over-the-counter which resolved however this did not help this pain.  Pain different this time and that his chest pain and pressure also radiated down his left arm and has caused some tingling in his left arm.  EKG here in the office with no signs of STEMI.  Multiple PVCs present which has previously been seen on prior EKGs. -Given that patient was having active belching in the office trial GI cocktail.  Patient reports that it did initially help but then his chest pain quickly returned.  -Advised that patient be seen in the emergency room in order to rule out MI with high-sensitivity troponins.  Called charge nurse and informed that patient would be sent over from our office.   -Did not do any other interventions given atypical presentation and patient low risk.  Known risk factors include BMI >30, and hypercholesterolemia with last LDL of 153. -Patient also in need of repeat BMP as he had potassium of 2.7 and AKI during an ED visit on 4/8 when he was sick with a GI bug.

## 2019-08-18 NOTE — Telephone Encounter (Signed)
Patient is transferred to nurse line for "chest pain." Patient was given an apt for this morning by the front office. Patient states he has a long history of acid reflux and feels this may be the cause of his "chest pain." Patient reports when he burps he feels much better. Patient is currently taking OTC for acid reflux. Patient advised to keep apt this morning. However, ED precautions given. If patient develops worsening symptoms or becomes short of breath. Patient agreed with plan.

## 2019-08-18 NOTE — ED Provider Notes (Signed)
[facility] EMERGENCY DEPARTMENT Provider Note   CSN: [id number] Arrival date & time: 08/18/19  1211     History Chief Complaint  Patient presents with  . Chest Pain    Tom Miranda is a 44 y.o. male.  Pt presents to the ED today with cp.  Pt has a hx of GERD and has had this pain in the past with GERD.  He started with the CP last night around 1100 pm.  The pt went to his pcp, had a GI cocktail; he did not improve, so was sent here.  Pt denies any other associated sx.  He did drink some alcohol last night and had some tacos.  He has also been trying to cut out meat by eating [employer] veggie burgers.        Past Medical History:  Diagnosis Date  . Asthma   . GERD (gastroesophageal reflux disease)     Patient Active Problem List   Diagnosis Date Noted  . Chest pain 08/18/2019  . Obesity (BMI 30.0-34.9) 06/02/2019  . Glaucoma 06/02/2019    History reviewed. No pertinent surgical history.     No family history on file.  Social History   Tobacco Use  . Smoking status: Never Smoker  . Smokeless tobacco: Never Used  Substance Use Topics  . Alcohol use: Not Currently  . Drug use: No    Home Medications Prior to Admission medications   Medication Sig Start Date End Date Taking? Authorizing Provider  dextromethorphan-guaiFENesin (MUCINEX DM) 30-600 MG 12hr tablet Take 1 tablet by mouth 2 (two) times daily. Patient not taking: Reported on 08/18/2019 03/16/18   Dahlia Byes A, NP  famotidine (PEPCID) 40 MG tablet Take 1 tablet (40 mg total) by mouth daily. 08/18/19   Jacalyn Lefevre, MD    Allergies    Patient has no known allergies.  Review of Systems   Review of Systems  Cardiovascular: Positive for chest pain.  All other systems reviewed and are negative.   Physical Exam Updated Vital Signs BP 130/60 (BP Location: Right Arm)   Pulse (!) 41   Temp 98.6 F (37 C) (Oral)   Resp 14   Ht 5\' 8"  (1.727 m)   Wt 97.1 kg   SpO2 98%   BMI  32.54 kg/m   Physical Exam Vitals and nursing note reviewed.  Constitutional:      Appearance: He is well-developed.  HENT:     Head: Normocephalic and atraumatic.  Eyes:     Extraocular Movements: Extraocular movements intact.     Pupils: Pupils are equal, round, and reactive to light.  Cardiovascular:     Rate and Rhythm: Normal rate and regular rhythm.     Heart sounds: Normal heart sounds.  Pulmonary:     Effort: Pulmonary effort is normal.     Breath sounds: Normal breath sounds.  Abdominal:     General: Bowel sounds are normal.     Palpations: Abdomen is soft.  Musculoskeletal:        General: Normal range of motion.     Cervical back: Normal range of motion and neck supple.  Skin:    General: Skin is warm.     Capillary Refill: Capillary refill takes less than 2 seconds.  Neurological:     General: No focal deficit present.     Mental Status: He is alert and oriented to person, place, and time.  Psychiatric:        Mood and Affect:  Mood normal.        Behavior: Behavior normal.     ED Results / Procedures / Treatments   Labs (all labs ordered are listed, but only abnormal results are displayed) Labs Reviewed  HEPATIC FUNCTION PANEL - Abnormal; Notable for the following components:      Result Value   AST 42 (*)    All other components within normal limits  BASIC METABOLIC PANEL  CBC  MAGNESIUM  TSH  TROPONIN I (HIGH SENSITIVITY)  TROPONIN I (HIGH SENSITIVITY)    EKG EKG Interpretation  Date/Time:  Wednesday Aug 18 2019 12:25:37 EDT Ventricular Rate:  87 PR Interval:  170 QRS Duration: 94 QT Interval:  376 QTC Calculation: 452 R Axis:   19 Text Interpretation: Sinus rhythm with sinus arrhythmia with frequent Premature ventricular complexes Nonspecific T wave abnormality Abnormal ECG No significant change since last tracing Confirmed by Isla Pence [phone]) on 08/18/2019 3:11:14 PM   Radiology DG Chest 2 View  Result Date: 08/18/2019 CLINICAL  DATA:  Chest pain for 4 days. EXAM: CHEST - 2 VIEW COMPARISON:  PA and lateral chest 01/09/2015. FINDINGS: Lungs clear. Heart size normal. No pneumothorax or pleural fluid. No acute or focal bony abnormality. IMPRESSION: Normal chest. Electronically Signed   By: Inge Rise M.D.   On: 08/18/2019 12:58    Procedures Procedures (including critical care time)  Medications Ordered in ED Medications  ketorolac (TORADOL) 30 MG/ML injection 30 mg (30 mg Intravenous Not Given 08/18/19 1627)    ED Course  I have reviewed the triage vital signs and the nursing notes.  Pertinent labs & imaging results that were available during my care of the patient were reviewed by me and considered in my medical decision making (see chart for details).    MDM Rules/Calculators/A&P                     Sx are likely due to GERD.   Heart score 0.   Pt is stable for d/c.  Return if worse.  Avoid alcohol/spicy foods. Final Clinical Impression(s) / ED Diagnoses Final diagnoses:  Atypical chest pain  PVC (premature ventricular contraction)    Rx / DC Orders ED Discharge Orders         Ordered    famotidine (PEPCID) 40 MG tablet  Daily     08/18/19 1625           Isla Pence, MD 08/18/19 1628

## 2019-08-18 NOTE — Progress Notes (Signed)
   SUBJECTIVE:   CHIEF COMPLAINT / HPI:   Atypical chest pain: Pressure in chest started last night in arm, shoulder and arm and down the arm. HE felt tingling in hs arm and had excessive burping. Pain with movement of arm. Burping up fluid. He has had the pain before but without the pain down the arm. Normally takes alka selzer, tums heartburn and gas and it helps. Now only helps when he burps. Has been constant. He can get chest pain when he's moving around or laying down. No SOB, sweating. Does not take any medications. Has never smoked. Drank a couple shots of tequila last night and also had tacos. Changed diet and is not eating meat. No abdominal pain. No illicit drug use. Constant pressure and intensifies with burp. Not hurts when he can feel it. Does not remember any trauma to his shoulder. Patient with no reported family history of cardiac problems.  PERTINENT  PMH / PSH: HLD  OBJECTIVE:  BP 118/82   Pulse 89   Ht 5\' 8"  (1.727 m)   Wt 214 lb (97.1 kg)   SpO2 96%   BMI 32.54 kg/m   General: NAD, pleasant Neck: Supple Cardiovascular: RRR, no m/r/g, no LE edema, nontender to palpation Respiratory: CTA BL, normal work of breathing Psych: AOx3, appropriate affect  ASSESSMENT/PLAN:   Chest pain Patient with history of indigestion.  Usually takes Alka-Seltzer and Tums over-the-counter which resolved however this did not help this pain.  Pain different this time and that his chest pain and pressure also radiated down his left arm and has caused some tingling in his left arm.  EKG here in the office with no signs of STEMI.  Multiple PVCs present which has previously been seen on prior EKGs. -Given that patient was having active belching in the office trial GI cocktail.  Patient reports that it did initially help but then his chest pain quickly returned.  -Advised that patient be seen in the emergency room in order to rule out MI with high-sensitivity troponins.  Called charge nurse and  informed that patient would be sent over from our office.   -Did not do any other interventions given atypical presentation and patient low risk.  Known risk factors include BMI >30, and hypercholesterolemia with last LDL of 153. -Patient also in need of repeat BMP as he had potassium of 2.7 and AKI during an ED visit on 4/8 when he was sick with a GI bug.    6/8 Catalaya Garr, DO PGY-3, [facility]

## 2019-08-18 NOTE — ED Triage Notes (Signed)
Patient states he was sent by family practice for cardiac work up after having unresolved chest pain from GI cocktail. Patient reports chest pain onset of 10:30pm last night, left sided chest/ shoulder pain.

## 2019-08-27 ENCOUNTER — Other Ambulatory Visit: Payer: Self-pay

## 2019-08-27 ENCOUNTER — Ambulatory Visit (INDEPENDENT_AMBULATORY_CARE_PROVIDER_SITE_OTHER): Payer: Medicaid Other | Admitting: Student in an Organized Health Care Education/Training Program

## 2019-08-27 ENCOUNTER — Other Ambulatory Visit (HOSPITAL_COMMUNITY)
Admission: RE | Admit: 2019-08-27 | Discharge: 2019-08-27 | Disposition: A | Discharge status: Home / Self Care | Payer: Medicaid Other | Source: Ambulatory Visit | Attending: Family Medicine | Admitting: Family Medicine

## 2019-08-27 VITALS — BP 125/80 | HR 80 | Ht 68.0 in | Wt 212.8 lb

## 2019-08-27 DIAGNOSIS — R102 Pelvic and perineal pain: Secondary | ICD-10-CM

## 2019-08-27 DIAGNOSIS — N4889 Other specified disorders of penis: Secondary | ICD-10-CM

## 2019-08-27 LAB — POCT URINALYSIS DIP (MANUAL ENTRY)
Bilirubin, UA: NEGATIVE
Blood, UA: NEGATIVE
Glucose, UA: NEGATIVE mg/dL
Ketones, POC UA: NEGATIVE mg/dL
Leukocytes, UA: NEGATIVE
Nitrite, UA: NEGATIVE
Protein Ur, POC: NEGATIVE mg/dL
Spec Grav, UA: 1.02 (ref 1.010–1.025)
Urobilinogen, UA: 0.2 E.U./dL
pH, UA: 7 (ref 5.0–8.0)

## 2019-08-27 NOTE — Progress Notes (Signed)
    SUBJECTIVE:   CHIEF COMPLAINT / HPI: polyuria, suprapubic pain  Suprapubic pain-  Patient endorses 1.5 weeks of urinary urgency and frequency but no dysuria or blood in urine. Denies any gross lesions to genital region.  Does not have any swelling or bumps in scrotum.  Endorses having tenderness to palpation of his glans, scrotum.  He also has pain in his scrotum when he is palpating his suprapubic region.  Associated with a low back ache as well. Patient is sexually active with multiple partners and does not regularly use barrier protection.  Denies history of STDs or known exposures.  PERTINENT  PMH / PSH: no known STDs  OBJECTIVE:   BP 125/80   Pulse 80   Ht 5\' 8"  (1.727 m)   Wt 212 lb 12.8 oz (96.5 kg)   SpO2 100%   BMI 32.36 kg/m   General: NAD, pleasant, able to participate in exam Genital:  Inspection revealed normal circumcised genitalia without lesions.  Palpation did not reveal any masses or fluid fluctuation. Non-tender. Cremaster reflex present.  Negative for inguinal hernias. Abdomen: soft, nontender, no suprapubic tenderness. , nondistended, no hepatic or splenomegaly, +BS Extremities: no edema or cyanosis. WWP. Skin: warm and dry, no rashes noted Neuro: alert and oriented x4, no focal deficits Psych: Normal affect and mood  ASSESSMENT/PLAN:   Penile pain Acute. Normal exam.  Urine screening to rule out UTI, nephrolithiasis, GC, Chlamydia.     , DO [facility]

## 2019-08-27 NOTE — Patient Instructions (Signed)
It was a pleasure to see you today!  To summarize our discussion for this visit:  Your exam was normal. I will let you know what the urine tests show for signs of infection. If you have worsening pain or fever, please call back or go to emergency department. I would advise that you abstain from sexual intercourse until results have returned as there could be a contagious component to your symtpoms.   Some additional health maintenance measures we should update are: Health Maintenance Due  Topic Date Due  . COVID-19 Vaccine (1) Never done  . TETANUS/TDAP  Never done  .    Please return to our clinic to see me as needed.  Call the clinic at [phone] if your symptoms worsen or you have any concerns.   Thank you for allowing me to take part in your care,  Dr. Jamelle Rushing

## 2019-08-27 NOTE — Assessment & Plan Note (Signed)
Acute. Normal exam.  Urine screening to rule out UTI, nephrolithiasis, GC, Chlamydia.

## 2019-08-30 LAB — URINE CYTOLOGY ANCILLARY ONLY
Chlamydia: NEGATIVE
Comment: NEGATIVE
Comment: NORMAL
Neisseria Gonorrhea: NEGATIVE

## 2019-09-16 DIAGNOSIS — H401123 Primary open-angle glaucoma, left eye, severe stage: Secondary | ICD-10-CM | POA: Diagnosis not present

## 2020-02-15 ENCOUNTER — Ambulatory Visit (INDEPENDENT_AMBULATORY_CARE_PROVIDER_SITE_OTHER): Payer: Medicaid Other | Admitting: Family Medicine

## 2020-02-15 ENCOUNTER — Encounter: Payer: Self-pay | Admitting: Family Medicine

## 2020-02-15 ENCOUNTER — Other Ambulatory Visit: Payer: Self-pay

## 2020-02-15 VITALS — BP 116/72 | HR 83 | Ht 68.0 in | Wt 216.0 lb

## 2020-02-15 DIAGNOSIS — R7303 Prediabetes: Secondary | ICD-10-CM

## 2020-02-15 DIAGNOSIS — E785 Hyperlipidemia, unspecified: Secondary | ICD-10-CM | POA: Diagnosis not present

## 2020-02-15 DIAGNOSIS — E1169 Type 2 diabetes mellitus with other specified complication: Secondary | ICD-10-CM

## 2020-02-15 DIAGNOSIS — M2550 Pain in unspecified joint: Secondary | ICD-10-CM

## 2020-02-15 DIAGNOSIS — K219 Gastro-esophageal reflux disease without esophagitis: Secondary | ICD-10-CM

## 2020-02-15 LAB — POCT GLYCOSYLATED HEMOGLOBIN (HGB A1C): Hemoglobin A1C: 6 % — AB (ref 4.0–5.6)

## 2020-02-15 NOTE — Patient Instructions (Signed)
It was great seeing you today.  I am going to check your cholesterol as well as your hemoglobin A1c to assess for diabetes.  Regarding your issues with joint pain I recommend Tylenol arthritic pain.  If the pain continues please follow-up with me.  Regarding your reflux I want you to start taking her Pepcid again and see if that will help with the chest burning.  We talked about symptoms for which you need to follow-up or seek medical attention immediately including a pain that is not resolved, chest pressure, pain rating down her left arm.  If you have any questions, concerns, issues please feel free to call the clinic.  I hope you have a wonderful afternoon!

## 2020-02-15 NOTE — Progress Notes (Signed)
    SUBJECTIVE:   CHIEF COMPLAINT / HPI:   Pain in joints Patient reports pain in his hips, knees, ankles which is worsened by activity and improves with rest.  Patient also reports he notices when he is dehydrated the pain is worse but as if he is hydrated it improves.  Reports this happens intermittently.  Feels okay right now.  Denies any swelling of his joints, erythema, warmth.  Has not taken any medications for this.  Diabetes concerns Patient reports he was prediabetic at the previous visit and is concerned that he now has diabetes.  He has been urinating more over the past week or 2.  Reports that after his previous visit he wanted to change his diet so he is vegetarian now.  Reflux Patient reports continued intermittent chest discomfort worse after eating and laying down.  Worse at night.  Does not occur with exertion.  Reports that burping helps with the pain.  He will also sit up and rub his left chest and the symptoms will resolve.  Reports that it is similar pain to when I saw him previously and that he took Pepcid and it resolved.  He has not been taking Pepcid recently but is planning on starting again.  No red flag symptoms such as blood in the stool, hemoptysis, abdominal tenderness.   OBJECTIVE:   BP 116/72   Pulse 83   Ht 5\' 8"  (1.727 m)   Wt 216 lb (98 kg)   SpO2 99%   BMI 32.84 kg/m   General: Well-appearing adult male, no acute distress, sitting on exam table Cardiac: Regular rate and rhythm, no murmurs appreciated, no chest wall tenderness noted Respiratory: Normal work of breathing Abdomen: Soft, nontender, positive bowel sounds MSK: Ambulates well, no decreased range of motion, no tenderness at this time  ASSESSMENT/PLAN:   Joint pain Patient reports joint pain in hips, knees, ankles worse with activity and improved by rest.  Also improved by hydration status.  Physical exam reassuring with no gross abnormalities.  Ambulates well.  Patient has tried no  measures to help with the pain and the pain is intermittent.  Recommended Tylenol arthritic pain as needed.  Patient will try this and will return if symptoms worsen or do not improve.  Prediabetes Previous hemoglobin A1c 6.0.  Patient reports increased urination over the last few weeks.  Is concerned that he is no longer prediabetic and has moved to diabetes. -Repeat hemoglobin A1c today was 6.0 -Recommended continued work with dietary changes -We will continue to monitor  GERD (gastroesophageal reflux disease) Patient reports continued issues with reflux symptoms.  Worsened after eating and laying down.  He took famotidine for short duration and symptoms resolved but they have now returned.  He is considering restarting the famotidine.  Reports that burping helps with the pain, sitting up helps with the pain.  Symptoms are worse at night and typically not present during the day.  Symptoms not exacerbated by exercise. -Recommended starting the Pepcid back -Red flag symptoms discussed -Strict return precautions given  Hyperlipidemia Elevated cholesterol at previous check.  Reports he has changed his diet to a vegan diet and feels like his cholesterol may be improved.  We discussed statin use at the previous visit and he wanted to try dietary changes first. -Recheck LDL today -We will discuss statin use pending those results     , MD [facility]

## 2020-02-16 DIAGNOSIS — E785 Hyperlipidemia, unspecified: Secondary | ICD-10-CM | POA: Insufficient documentation

## 2020-02-16 DIAGNOSIS — R7303 Prediabetes: Secondary | ICD-10-CM | POA: Insufficient documentation

## 2020-02-16 DIAGNOSIS — K219 Gastro-esophageal reflux disease without esophagitis: Secondary | ICD-10-CM | POA: Insufficient documentation

## 2020-02-16 DIAGNOSIS — M2550 Pain in unspecified joint: Secondary | ICD-10-CM | POA: Insufficient documentation

## 2020-02-16 LAB — LDL CHOLESTEROL, DIRECT: LDL Direct: 163 mg/dL — ABNORMAL HIGH (ref 0–99)

## 2020-02-16 NOTE — Assessment & Plan Note (Signed)
Patient reports joint pain in hips, knees, ankles worse with activity and improved by rest.  Also improved by hydration status.  Physical exam reassuring with no gross abnormalities.  Ambulates well.  Patient has tried no measures to help with the pain and the pain is intermittent.  Recommended Tylenol arthritic pain as needed.  Patient will try this and will return if symptoms worsen or do not improve.

## 2020-02-16 NOTE — Assessment & Plan Note (Signed)
Patient reports continued issues with reflux symptoms.  Worsened after eating and laying down.  He took famotidine for short duration and symptoms resolved but they have now returned.  He is considering restarting the famotidine.  Reports that burping helps with the pain, sitting up helps with the pain.  Symptoms are worse at night and typically not present during the day.  Symptoms not exacerbated by exercise. -Recommended starting the Pepcid back -Red flag symptoms discussed -Strict return precautions given

## 2020-02-16 NOTE — Assessment & Plan Note (Signed)
Previous hemoglobin A1c 6.0.  Patient reports increased urination over the last few weeks.  Is concerned that he is no longer prediabetic and has moved to diabetes. -Repeat hemoglobin A1c today was 6.0 -Recommended continued work with dietary changes -We will continue to monitor

## 2020-02-16 NOTE — Assessment & Plan Note (Signed)
Elevated cholesterol at previous check.  Reports he has changed his diet to a vegan diet and feels like his cholesterol may be improved.  We discussed statin use at the previous visit and he wanted to try dietary changes first. -Recheck LDL today -We will discuss statin use pending those results

## 2020-02-18 ENCOUNTER — Telehealth: Payer: Self-pay | Admitting: Family Medicine

## 2020-02-18 MED ORDER — ROSUVASTATIN CALCIUM 10 MG PO TABS: 10.0000 mg | ORAL_TABLET | Freq: Every day | ORAL | 3 refills | Status: DC

## 2020-02-18 NOTE — Telephone Encounter (Signed)
Called patient and discussed hemoglobin A1c as well as LDL results.  LDL has slightly increased from previous check.  Discussed continuation of dietary changes and patient is interested in medication management.  ASCVD risk is 3.1% at this time but patient is prediabetic.  I am starting Crestor 10 mg daily.  We will follow-up as needed and will have repeat cholesterol check in 2 to 3 months.

## 2020-07-24 ENCOUNTER — Encounter (HOSPITAL_COMMUNITY): Payer: Self-pay

## 2020-07-24 ENCOUNTER — Ambulatory Visit (HOSPITAL_COMMUNITY)
Admission: EM | Admit: 2020-07-24 | Discharge: 2020-07-24 | Disposition: A | Discharge status: Home / Self Care | Payer: Medicaid Other | Attending: Student | Admitting: Student

## 2020-07-24 ENCOUNTER — Other Ambulatory Visit: Payer: Self-pay

## 2020-07-24 DIAGNOSIS — H66001 Acute suppurative otitis media without spontaneous rupture of ear drum, right ear: Secondary | ICD-10-CM

## 2020-07-24 DIAGNOSIS — R001 Bradycardia, unspecified: Secondary | ICD-10-CM | POA: Diagnosis not present

## 2020-07-24 DIAGNOSIS — K219 Gastro-esophageal reflux disease without esophagitis: Secondary | ICD-10-CM

## 2020-07-24 MED ORDER — AMOXICILLIN 875 MG PO TABS: 875.0000 mg | ORAL_TABLET | Freq: Two times a day (BID) | ORAL | 0 refills | Status: AC

## 2020-07-24 NOTE — ED Provider Notes (Signed)
[facility]    CSN: [id number] Arrival date & time: 07/24/20  9323      History   Chief Complaint Chief Complaint  Patient presents with  . Otalgia    HPI Tom Miranda is a 45 y.o. male presenting with 3 days of bilateral ear pain R>L.  Medical history asthma, GERD, prediabetes, hyperlipidemia, obesity.  Notes sharp right-sided ear pain as well as pain around the ear.  Denies hearing changes, muffled hearing, tinnitus, dizziness, fever/chills, congestion, sinus pain, nausea/vomiting/diarrhea, headaches, chest pain, dizziness, shortness of breath.  Has been taking Tylenol for symptomatic relief but denies any other new medications.  Has been taking his famotidine as directed for GERD, with good control of this.  HPI  Past Medical History:  Diagnosis Date  . Asthma   . GERD (gastroesophageal reflux disease)     Patient Active Problem List   Diagnosis Date Noted  . Prediabetes 02/16/2020  . Hyperlipidemia 02/16/2020  . Joint pain 02/16/2020  . GERD (gastroesophageal reflux disease)   . Penile pain 08/27/2019  . Obesity (BMI 30.0-34.9) 06/02/2019  . Glaucoma 06/02/2019    History reviewed. No pertinent surgical history.     Home Medications    Prior to Admission medications   Medication Sig Start Date End Date Taking? Authorizing Provider  amoxicillin (AMOXIL) 875 MG tablet Take 1 tablet (875 mg total) by mouth 2 (two) times daily for 7 days. 07/24/20 07/31/20 Yes Rhys Martini, PA-C  famotidine (PEPCID) 40 MG tablet Take 1 tablet (40 mg total) by mouth daily. 08/18/19   Jacalyn Lefevre, MD  rosuvastatin (CRESTOR) 10 MG tablet Take 1 tablet (10 mg total) by mouth daily. 02/18/20   Derrel Nip, MD    Family History History reviewed. No pertinent family history.  Social History Social History   Tobacco Use  . Smoking status: Never Smoker  . Smokeless tobacco: Never Used  Substance Use Topics  . Alcohol use: Not Currently  . Drug use: No      Allergies   Patient has no known allergies.   Review of Systems Review of Systems  Constitutional: Negative for appetite change, chills and fever.  HENT: Positive for ear pain. Negative for congestion, rhinorrhea, sinus pressure, sinus pain and sore throat.   Eyes: Negative for redness and visual disturbance.  Respiratory: Negative for cough, chest tightness, shortness of breath and wheezing.   Cardiovascular: Negative for chest pain and palpitations.  Gastrointestinal: Negative for abdominal pain, constipation, diarrhea, nausea and vomiting.  Genitourinary: Negative for dysuria, frequency and urgency.  Musculoskeletal: Negative for myalgias.  Neurological: Negative for dizziness, weakness and headaches.  Psychiatric/Behavioral: Negative for confusion.  All other systems reviewed and are negative.    Physical Exam Triage Vital Signs ED Triage Vitals  Enc Vitals Group     BP 07/24/20 0958 125/61     Pulse Rate 07/24/20 0958 (!) 43     Resp 07/24/20 0958 20     Temp 07/24/20 0958 98 F (36.7 C)     Temp src --      SpO2 07/24/20 0958 97 %     Weight --      Height --      Head Circumference --      Peak Flow --      Pain Score 07/24/20 0957 9     Pain Loc --      Pain Edu? --      Excl. in GC? --    No  data found.  Updated Vital Signs BP 125/61   Pulse 72 Comment: per Jona Erkkila, pa and EKG  Temp 98 F (36.7 C)   Resp 20   SpO2 97%   Visual Acuity Right Eye Distance:   Left Eye Distance:   Bilateral Distance:    Right Eye Near:   Left Eye Near:    Bilateral Near:     Physical Exam Vitals reviewed.  Constitutional:      General: He is not in acute distress.    Appearance: Normal appearance. He is not ill-appearing or diaphoretic.  HENT:     Head: Normocephalic and atraumatic.     Right Ear: Hearing, ear canal and external ear normal. No swelling or tenderness. There is no impacted cerumen. No mastoid tenderness. Tympanic membrane is erythematous and  bulging. Tympanic membrane is not perforated or retracted.     Left Ear: Hearing, tympanic membrane, ear canal and external ear normal. No swelling or tenderness. There is no impacted cerumen. No mastoid tenderness. Tympanic membrane is not perforated, erythematous, retracted or bulging.     Nose:     Right Sinus: No maxillary sinus tenderness or frontal sinus tenderness.     Left Sinus: No maxillary sinus tenderness or frontal sinus tenderness.     Mouth/Throat:     Mouth: Mucous membranes are moist.     Pharynx: Uvula midline. No oropharyngeal exudate or posterior oropharyngeal erythema.     Tonsils: No tonsillar exudate.  Eyes:     Extraocular Movements: Extraocular movements intact.     Pupils: Pupils are equal, round, and reactive to light.  Cardiovascular:     Rate and Rhythm: Normal rate and regular rhythm.     Pulses:          Radial pulses are 2+ on the right side and 2+ on the left side.     Heart sounds: Normal heart sounds.  Pulmonary:     Effort: Pulmonary effort is normal.     Breath sounds: Normal breath sounds and air entry. No wheezing, rhonchi or rales.  Chest:     Chest wall: No tenderness.  Abdominal:     General: Abdomen is flat. Bowel sounds are normal.     Palpations: Abdomen is soft.     Tenderness: There is no abdominal tenderness. There is no guarding or rebound.  Musculoskeletal:     Right lower leg: No edema.     Left lower leg: No edema.  Lymphadenopathy:     Cervical: No cervical adenopathy.  Skin:    General: Skin is warm.     Capillary Refill: Capillary refill takes less than 2 seconds.  Neurological:     General: No focal deficit present.     Mental Status: He is alert and oriented to person, place, and time.  Psychiatric:        Attention and Perception: Attention and perception normal.        Mood and Affect: Mood and affect normal.        Behavior: Behavior normal. Behavior is cooperative.        Thought Content: Thought content normal.         Judgment: Judgment normal.      UC Treatments / Results  Labs (all labs ordered are listed, but only abnormal results are displayed) Labs Reviewed - No data to display  EKG   Radiology No results found.  Procedures Procedures (including critical care time)  Medications Ordered in UC Medications - No  data to display  Initial Impression / Assessment and Plan / UC Course  I have reviewed the triage vital signs and the nursing notes.  Pertinent labs & imaging results that were available during my care of the patient were reviewed by me and considered in my medical decision making (see chart for details).     This patient is a 45 year old male presenting with right otitis media.  Afebrile, nontachycardic.  HR initially noted to be 46, however I suspect this was a machine error. EKG is NSR with HR of 72. Patient is completely asymptomatic- no dizziness, chest pain, shortness of breath.   For GERD, continue famotidine.  For AOM, amoxicillin sent.   ED return precautions discussed.  Final Clinical Impressions(s) / UC Diagnoses   Final diagnoses:  Non-recurrent acute suppurative otitis media of right ear without spontaneous rupture of tympanic membrane  Gastroesophageal reflux disease without esophagitis     Discharge Instructions     -Start the antibiotic, amoxicillin twice daily for 7 days.  You can take this with food if you have a sensitive stomach, like with breakfast and dinner. -For GERD, continue famotidine -Seek additional medical attention if your symptoms worsen or persist, like worsening of your pain despite treatment, new fever/chills, dizziness, headaches, shortness of breath, chest pain.    ED Prescriptions    Medication Sig Dispense Auth. Provider   amoxicillin (AMOXIL) 875 MG tablet Take 1 tablet (875 mg total) by mouth 2 (two) times daily for 7 days. 14 tablet Rhys Martini, PA-C     PDMP not reviewed this encounter.   Rhys Martini,  PA-C 07/24/20 1048

## 2020-07-24 NOTE — Discharge Instructions (Addendum)
-  Start the antibiotic, amoxicillin twice daily for 7 days.  You can take this with food if you have a sensitive stomach, like with breakfast and dinner. -For GERD, continue famotidine -Seek additional medical attention if your symptoms worsen or persist, like worsening of your pain despite treatment, new fever/chills, dizziness, headaches, shortness of breath, chest pain.

## 2020-07-24 NOTE — ED Triage Notes (Signed)
Pt in with c/o right ear pain that has been going on for 3 days now  Pt has been taking tylenol with no relief

## 2020-07-25 ENCOUNTER — Ambulatory Visit: Payer: Medicaid Other | Admitting: Family Medicine

## 2020-07-25 VITALS — BP 110/66 | HR 45 | Ht 68.0 in | Wt 216.6 lb

## 2020-07-25 DIAGNOSIS — R21 Rash and other nonspecific skin eruption: Secondary | ICD-10-CM | POA: Insufficient documentation

## 2020-07-25 DIAGNOSIS — R7303 Prediabetes: Secondary | ICD-10-CM

## 2020-07-25 DIAGNOSIS — H6691 Otitis media, unspecified, right ear: Secondary | ICD-10-CM

## 2020-07-25 LAB — POCT GLYCOSYLATED HEMOGLOBIN (HGB A1C): Hemoglobin A1C: 6 % — AB (ref 4.0–5.6)

## 2020-07-25 MED ORDER — NYSTATIN 100000 UNIT/GM EX POWD: 1.0000 "application " | Freq: Two times a day (BID) | CUTANEOUS | 0 refills | Status: AC

## 2020-07-25 NOTE — Assessment & Plan Note (Signed)
Patient was treated for right otitis media yesterday in urgent care with amoxicillin.  On examination today normal-appearing tympanic membranes however patient still has persistent right-sided ear pain.  Recommended patient finish his course of antibiotics, recommended Tylenol and Motrin for pain.  Follow-up with PCP if no improvement in symptoms.

## 2020-07-25 NOTE — Progress Notes (Signed)
     SUBJECTIVE:   CHIEF COMPLAINT / HPI:   Tom Miranda is a 45 y.o. male presents for right ear pain  Right ear pain  Seen in urgent care yesterday for ear pain right >left. Pain started 4 days ago. It is improving a little. 5/10 severity. He was started on amoxicillin for otitis media. Taking tylenol with small improvement. No ear drainage. New sore throat since yesterday. No fevers.   Axillary rash 2 month hx of rash under axilla. He initially thought it was eczema. It is itchy. He changed his soap which helped a little. He has used foot antifungal cream under the axilla. Denies having this before. No insect bites, hiking, fevers etc.   Flowsheet Row Office Visit from 02/15/2020 in [facility]  PHQ-9 Total Score 0       Health Maintenance Due  Topic  . Hepatitis C Screening   . PNEUMOCOCCAL POLYSACCHARIDE VACCINE AGE 34-64 HIGH RISK   . FOOT EXAM   . OPHTHALMOLOGY EXAM   . URINE MICROALBUMIN   . COVID-19 Vaccine (1)  . TETANUS/TDAP       PERTINENT  PMH / PSH:  HLD, GERD, glaucoma   OBJECTIVE:   BP 110/66   Pulse (!) 45   Ht 5\' 8"  (1.727 m)   Wt 216 lb 9.6 oz (98.2 kg)   SpO2 99%   BMI 32.93 kg/m    General: Alert, no acute distress HEENT: NCAT, no lymphadenopathy, normal appearing external ear, no ear drainage, normal tympanic membranes bilaterally, normal dentition Axillary: Hyperpigmented rash in axillary creases bilaterally Cardio: Well-perfused Pulm: normal work of breathing Neuro: Cranial nerves grossly intact       ASSESSMENT/PLAN:   Rash Unclear cause of rash.  Differentials include acanthosis nigricans, intertrigo, hidradenitis suppurativa.  Patient is prediabetic making acanthosis nigricans more likely. Last A1c was 6 5 months ago.  Will obtain repeat A1c today.  Patient denies new blurred vision, abdominal pains, vomiting, fatigue which is reassuring.  Could also be a trigger.  We will also give patient nystatin powder to  apply to her armpits to see if this helps.  Follow-up with PCP.  Otitis media, right Patient was treated for right otitis media yesterday in urgent care with amoxicillin.  On examination today normal-appearing tympanic membranes however patient still has persistent right-sided ear pain.  Recommended patient finish his course of antibiotics, recommended Tylenol and Motrin for pain.  Follow-up with PCP if no improvement in symptoms.  Prediabetes A1c obtained today.      , MD PGY-2 [facility]

## 2020-07-25 NOTE — Assessment & Plan Note (Addendum)
Unclear cause of rash.  Differentials include acanthosis nigricans, intertrigo, hidradenitis suppurativa.  Patient is prediabetic making acanthosis nigricans more likely. Last A1c was 6 5 months ago.  Will obtain repeat A1c today.  Patient denies new blurred vision, abdominal pains, vomiting, fatigue which is reassuring.  Could also be a trigger.  We will also give patient nystatin powder to apply to her armpits to see if this helps.  Follow-up with PCP.

## 2020-07-25 NOTE — Patient Instructions (Signed)
Thank you for coming to see me today. It was a pleasure. Today we discussed your ear pain.  Your inner ear looks good today however I would continue the antibiotics based on your symptoms.  I recommend Tylenol and Motrin for the pain  With regards to your armpit rash it could be signs of insulin resistance.  We are checking your diabetes level today.  I will call you with abnormal.  Could also be a fungal infection or an allergic reaction to something.  You can try nystatin powder under the armpits and see if this helps.  Please follow-up with your PCP if no improvement in symptoms.  If you have any questions or concerns, please do not hesitate to call the office at [phone].  Best wishes,   Dr Allena Katz

## 2020-07-25 NOTE — Assessment & Plan Note (Signed)
A1c obtained today. 

## 2020-08-21 ENCOUNTER — Other Ambulatory Visit: Payer: Self-pay

## 2020-08-21 ENCOUNTER — Telehealth: Payer: Self-pay

## 2020-08-21 ENCOUNTER — Ambulatory Visit (HOSPITAL_COMMUNITY)
Admission: RE | Admit: 2020-08-21 | Discharge: 2020-08-21 | Disposition: A | Discharge status: Home / Self Care | Payer: Medicaid Other | Source: Ambulatory Visit | Attending: Family Medicine | Admitting: Family Medicine

## 2020-08-21 ENCOUNTER — Ambulatory Visit (INDEPENDENT_AMBULATORY_CARE_PROVIDER_SITE_OTHER): Payer: Medicaid Other | Admitting: Family Medicine

## 2020-08-21 ENCOUNTER — Encounter: Payer: Self-pay | Admitting: Family Medicine

## 2020-08-21 VITALS — BP 132/80 | HR 95 | Ht 68.0 in | Wt 214.6 lb

## 2020-08-21 DIAGNOSIS — R079 Chest pain, unspecified: Secondary | ICD-10-CM

## 2020-08-21 NOTE — Telephone Encounter (Signed)
Patient calls nurse line requesting to schedule appointment for chest pain. Patient reports that pain started over the weekend. States that pain is on left side and is intermittent with deep breaths. Patient has history of GERD and reports that pain feels similar to heartburn, however would like evaluation.   Precepted with Dr. Lum Babe. Advised that patient could be seen in clinic. Scheduled patient same day appointment for this afternoon with Dr. Mauri Reading.   Strict ED precautions given in the meantime.   Veronda Prude, RN

## 2020-08-21 NOTE — Patient Instructions (Signed)
I am collecting lab work and referring you to cardiology for further evaluation.  I will call you with the results if they are abnormal. Please expect a call from the cardiologist to schedule

## 2020-08-21 NOTE — Assessment & Plan Note (Addendum)
Acute left sided pleuritic chest pain that occurs at rest and with exertion and associated with lightheadedness. No radiation or diaphoresis. No cough or infectious symptoms. Overall hemodynamically stable with reassuring exam. Has history of indigestion that he feels is similar in nature to this intermittent pain. EKG in office without ST changes, does have frequent PVC's which have been noted on several EKGs. He is overall low risk for CAD (HLD only risk factor). Marburg and Interchest decision calculators noted as low risk overall. Low suspicion for PE or heart failure. Low suspicion for infectious process. He did note increased stress recently however PHQ-SAD overall negative (PHQ-15=3, GAD-7=2, PHQ-9=0). Not reproducible on exam. Will start with basic blood work and refer to cardiology for further evaluation.  - CBC, BMP, Mag, BNP, CXR - referral to cardiology

## 2020-08-21 NOTE — Progress Notes (Signed)
Subjective:   Patient ID: Tom Miranda    DOB: 03/02/1976, 45 y.o. male   MRN: [id number]  Tom Miranda is a 45 y.o. male with a history of GERD, rash, glaucoma, HLD, joint pain, obesity, penile pain, prediaebtes here for chest pain  CHEST DISCOMFORT Chest pain along left side of chest when taking deep breaths. Gets somewhat "faintish" feeling - feels lightheaded and needs to sit down.  Character (burning, tearing, pressure, pleuritic): feels like a cramp Provoking activities: cant identify any triggers Relieving activities: deep breaths, sitting down, rubbing chest, moving arm around, drinking water helps it go away.  Onset and Course:  Happens at rest or walking. Comes out of no-where. Off and on.  Duration: Lasts about 7-8 minutes.  Location Radiation: none  Effect on function:   Associated Symptoms: Shortness of Breath: None Leg swelling: None  Recent Immobilization: no surgeries or travel Hemoptysis: no Nausea or Diaphoresis: no Reflux symptoms: does have history of reflux and this does feel similar but usually doesn't feel "faintish" Fever: no Cough Productive:  no Edema:  no  Pt thinks might be:  Acid reflux but what concerned him was the lightheadedness that was associated with it.  Does note more stress lately.   PMH CAD history: None. Denies CAD, MI/Stroke, HTN. Does have history of HLD.  Cancer history:  no Pulmonary Emboli history: no Smoking hx: none  PMH: denies any pertinent history   Decision Calculators    Marburg Calculator: 2 points indicated 3% CAD risk and outpatient evaluation as needed    INTERCHEST Decision Rule: 1 point for possible CP related to effort - low CAD risk with 2.1% probability of CAD    Wells' PE criteria : 0  Review of Systems:  Per HPI.   Objective:   BP 132/80   Pulse 95   Ht 5\' 8"  (1.727 m)   Wt 214 lb 9.6 oz (97.3 kg)   SpO2 97%   BMI 32.63 kg/m  Vitals and nursing note reviewed.  General: pleasant middle aged  male, sitting comfortably in exam chair, well nourished, well developed, in no acute distress with non-toxic appearance CV: regular rate and rhythm without murmurs, rubs, or gallops, no lower extremity edema, 2+ radial and pedal pulses bilaterally Lungs: clear to auscultation bilaterally with normal work of breathing on room air, speaking in full sentences Abdomen: soft, non-tender, non-distended, normoactive bowel sounds Skin: warm, dry,  MSK:  gait normal, chest wall nontender to palpation Neuro: Alert and oriented, speech normal  Assessment & Plan:   Chest pain Acute left sided pleuritic chest pain that occurs at rest and with exertion and associated with lightheadedness. No radiation or diaphoresis. No cough or infectious symptoms. Overall hemodynamically stable with reassuring exam. Has history of indigestion that he feels is similar in nature to this intermittent pain. EKG in office without ST changes, does have frequent PVC's which have been noted on several EKGs. He is overall low risk for CAD (HLD only risk factor). Marburg and Interchest decision calculators noted as low risk overall. Low suspicion for PE or heart failure. Low suspicion for infectious process. He did note increased stress recently however PHQ-SAD overall negative (PHQ-15=3, GAD-7=2, PHQ-9=0). Not reproducible on exam. Will start with basic blood work and refer to cardiology for further evaluation.  - CBC, BMP, Mag, BNP, CXR - referral to cardiology  Orders Placed This Encounter  Procedures  . DG Chest 2 View    Standing Status:   Future  Standing Expiration Date:   08/21/2021    Order Specific Question:   Reason for Exam (SYMPTOM  OR DIAGNOSIS REQUIRED)    Answer:   pleuritic chest pain    Order Specific Question:   Preferred imaging location?    Answer:   [facility]  . CBC  . Brain natriuretic peptide  . Basic Metabolic Panel  . Magnesium  . Ambulatory referral to Cardiology    Referral Priority:    Routine    Referral Type:   Consultation    Referral Reason:   Specialty Services Required    Requested Specialty:   Cardiology    Number of Visits Requested:   1  . EKG 12-Lead  . EKG 12-Lead    Ordered by an unspecified provider   . EKG 12-Lead    Ordered by an unspecified provider    No orders of the defined types were placed in this encounter.     Orpah Cobb, DO PGY-3, [facility] 08/21/2020 5:35 PM

## 2020-08-22 ENCOUNTER — Other Ambulatory Visit: Payer: Medicaid Other

## 2020-08-24 ENCOUNTER — Other Ambulatory Visit: Payer: Medicaid Other

## 2020-10-01 ENCOUNTER — Encounter (HOSPITAL_COMMUNITY): Payer: Self-pay

## 2020-10-01 ENCOUNTER — Other Ambulatory Visit: Payer: Self-pay

## 2020-10-01 ENCOUNTER — Emergency Department (HOSPITAL_COMMUNITY)
Admission: EM | Admit: 2020-10-01 | Discharge: 2020-10-01 | Disposition: A | Discharge status: Home / Self Care | Payer: Medicaid Other | Attending: Emergency Medicine | Admitting: Emergency Medicine

## 2020-10-01 ENCOUNTER — Emergency Department (HOSPITAL_COMMUNITY): Payer: Medicaid Other

## 2020-10-01 DIAGNOSIS — J45909 Unspecified asthma, uncomplicated: Secondary | ICD-10-CM | POA: Insufficient documentation

## 2020-10-01 DIAGNOSIS — S0990XA Unspecified injury of head, initial encounter: Secondary | ICD-10-CM | POA: Insufficient documentation

## 2020-10-01 DIAGNOSIS — Y92512 Supermarket, store or market as the place of occurrence of the external cause: Secondary | ICD-10-CM | POA: Diagnosis not present

## 2020-10-01 DIAGNOSIS — S0993XA Unspecified injury of face, initial encounter: Secondary | ICD-10-CM | POA: Diagnosis not present

## 2020-10-01 DIAGNOSIS — W228XXA Striking against or struck by other objects, initial encounter: Secondary | ICD-10-CM | POA: Insufficient documentation

## 2020-10-01 NOTE — ED Provider Notes (Signed)
[facility]-EMERGENCY DEPT Provider Note   CSN: [id number] Arrival date & time: 10/01/20  0116     History Chief Complaint  Patient presents with   Head Injury    Tom Miranda is a 45 y.o. male.  The history is provided by the patient.  Head Injury Location:  Frontal Time since incident:  4 hours Mechanism of injury comment:  Hit with a heavy door in the forehead Pain details:    Quality:  Aching   Severity:  Moderate   Duration:  4 hours   Timing:  Constant   Progression:  Unchanged Chronicity:  New Relieved by:  Nothing Worsened by:  Nothing Ineffective treatments:  None tried Associated symptoms: no blurred vision, no difficulty breathing, no disorientation, no double vision, no focal weakness, no hearing loss, no loss of consciousness, no memory loss, no nausea, no neck pain, no numbness, no seizures, no tinnitus and no vomiting   Risk factors: no alcohol use       Past Medical History:  Diagnosis Date   Asthma    GERD (gastroesophageal reflux disease)     Patient Active Problem List   Diagnosis Date Noted   Prediabetes 02/16/2020   Hyperlipidemia 02/16/2020   Joint pain 02/16/2020   GERD (gastroesophageal reflux disease)    Penile pain 08/27/2019   Chest pain 08/18/2019   Obesity (BMI 30.0-34.9) 06/02/2019   Glaucoma 06/02/2019    History reviewed. No pertinent surgical history.     History reviewed. No pertinent family history.  Social History   Tobacco Use   Smoking status: Never   Smokeless tobacco: Never  Substance Use Topics   Alcohol use: Not Currently   Drug use: No    Home Medications Prior to Admission medications   Medication Sig Start Date End Date Taking? Authorizing Provider  famotidine (PEPCID) 40 MG tablet Take 1 tablet (40 mg total) by mouth daily. 08/18/19  Yes Jacalyn Lefevre, MD  rosuvastatin (CRESTOR) 10 MG tablet Take 1 tablet (10 mg total) by mouth daily. Patient not taking: Reported on 10/01/2020  02/18/20   Derrel Nip, MD    Allergies    Patient has no known allergies.  Review of Systems   Review of Systems  Constitutional:  Negative for fever.  HENT:  Negative for hearing loss and tinnitus.   Eyes:  Negative for blurred vision, double vision, redness and visual disturbance.  Respiratory:  Negative for wheezing.   Cardiovascular:  Negative for leg swelling.  Gastrointestinal:  Negative for nausea and vomiting.  Genitourinary:  Negative for difficulty urinating.  Musculoskeletal:  Negative for neck pain.  Neurological:  Negative for focal weakness, seizures, loss of consciousness and numbness.  Psychiatric/Behavioral:  Negative for agitation and memory loss.   All other systems reviewed and are negative.  Physical Exam Updated Vital Signs BP 119/75 (BP Location: Left Arm)   Pulse 77   Temp 98.4 F (36.9 C) (Oral)   Ht 5\' 8"  (1.727 m)   Wt 97 kg   SpO2 100%   BMI 32.52 kg/m   Physical Exam Vitals and nursing note reviewed.  Constitutional:      General: He is not in acute distress.    Appearance: Normal appearance.  HENT:     Head: Normocephalic and atraumatic.     Nose: Nose normal.  Eyes:     Conjunctiva/sclera: Conjunctivae normal.     Pupils: Pupils are equal, round, and reactive to light.  Cardiovascular:  Rate and Rhythm: Normal rate and regular rhythm.     Pulses: Normal pulses.     Heart sounds: Normal heart sounds.  Pulmonary:     Effort: Pulmonary effort is normal.     Breath sounds: Normal breath sounds.  Abdominal:     General: Abdomen is flat. Bowel sounds are normal.     Palpations: Abdomen is soft.     Tenderness: There is no abdominal tenderness. There is no guarding.  Musculoskeletal:        General: Normal range of motion.     Cervical back: Normal range of motion and neck supple.  Skin:    General: Skin is warm and dry.     Capillary Refill: Capillary refill takes less than 2 seconds.  Neurological:     General: No focal  deficit present.     Mental Status: He is alert and oriented to person, place, and time.     Deep Tendon Reflexes: Reflexes normal.  Psychiatric:        Mood and Affect: Mood normal.        Behavior: Behavior normal.    ED Results / Procedures / Treatments   Labs (all labs ordered are listed, but only abnormal results are displayed) Labs Reviewed - No data to display  EKG None  Radiology No results found.  Procedures Procedures   Medications Ordered in ED Medications - No data to display  ED Course  I have reviewed the triage vital signs and the nursing notes.  Pertinent labs & imaging results that were available during my care of the patient were reviewed by me and considered in my medical decision making (see chart for details).   No intracranial bleed.  Well appearing stable for discharge with close follow up.     Tom Miranda was evaluated in Emergency Department on 10/01/2020 for the symptoms described in the history of present illness. He was evaluated in the context of the global COVID-19 pandemic, which necessitated consideration that the patient might be at risk for infection with the SARS-CoV-2 virus that causes COVID-19. Institutional protocols and algorithms that pertain to the evaluation of patients at risk for COVID-19 are in a state of rapid change based on information released by regulatory bodies including the CDC and federal and state organizations. These policies and algorithms were followed during the patient's care in the ED.  Final Clinical Impression(s) / ED Diagnoses   Return for intractable cough, coughing up blood, fevers > 100.4 unrelieved by medication, shortness of breath, intractable vomiting, chest pain, shortness of breath, weakness, numbness, changes in speech, facial asymmetry, abdominal pain, passing out, Inability to tolerate liquids or food, cough, altered mental status or any concerns. No signs of systemic illness or infection. The patient  is nontoxic-appearing on exam and vital signs are within normal limits. I have reviewed the triage vital signs and the nursing notes. Pertinent labs & imaging results that were available during my care of the patient were reviewed by me and considered in my medical decision making (see chart for details). After history, exam, and medical workup I feel the patient has been appropriately medically screened and is safe for discharge home. Pertinent diagnoses were discussed with the patient. Patient was given return precautions.     Tacie Mccuistion, MD 10/01/20 3662

## 2020-10-01 NOTE — ED Triage Notes (Signed)
Pt to ED from home with c/o head injury. Pt states a freezer door fell off the hinges at the grocery store and hit him above the left eye, denies LOC. Skin is intact, no swelling or bruising noted. Arrives A+O, VSS, NADN.

## 2020-10-02 ENCOUNTER — Telehealth: Payer: Self-pay | Admitting: *Deleted

## 2020-10-02 NOTE — Telephone Encounter (Signed)
Transition Care Management Unsuccessful Follow-up Telephone Call  Date of discharge and from where:  10/01/2020 - [facility]  Attempts:  1st Attempt  Reason for unsuccessful TCM follow-up call:  Left voice message

## 2020-10-03 NOTE — Telephone Encounter (Signed)
Transition Care Management Unsuccessful Follow-up Telephone Call  Date of discharge and from where:  10/01/2020 form [facility]  Attempts:  2nd Attempt  Reason for unsuccessful TCM follow-up call:  Left voice message

## 2020-10-04 NOTE — Telephone Encounter (Signed)
Transition Care Management Unsuccessful Follow-up Telephone Call  Date of discharge and from where:  10/01/2020 - [facility]  Attempts:  3rd Attempt  Reason for unsuccessful TCM follow-up call:  Left voice message

## 2020-10-05 ENCOUNTER — Other Ambulatory Visit: Payer: Self-pay

## 2020-10-05 ENCOUNTER — Encounter (HOSPITAL_COMMUNITY): Payer: Self-pay | Admitting: *Deleted

## 2020-10-05 ENCOUNTER — Emergency Department (HOSPITAL_COMMUNITY)
Admission: EM | Admit: 2020-10-05 | Discharge: 2020-10-05 | Disposition: A | Discharge status: Home / Self Care | Payer: Medicaid Other | Attending: Emergency Medicine | Admitting: Emergency Medicine

## 2020-10-05 DIAGNOSIS — J45909 Unspecified asthma, uncomplicated: Secondary | ICD-10-CM | POA: Insufficient documentation

## 2020-10-05 DIAGNOSIS — R519 Headache, unspecified: Secondary | ICD-10-CM | POA: Insufficient documentation

## 2020-10-05 DIAGNOSIS — R630 Anorexia: Secondary | ICD-10-CM | POA: Insufficient documentation

## 2020-10-05 DIAGNOSIS — H53149 Visual discomfort, unspecified: Secondary | ICD-10-CM | POA: Diagnosis not present

## 2020-10-05 DIAGNOSIS — S060X9A Concussion with loss of consciousness of unspecified duration, initial encounter: Secondary | ICD-10-CM | POA: Diagnosis not present

## 2020-10-05 DIAGNOSIS — F0781 Postconcussional syndrome: Secondary | ICD-10-CM

## 2020-10-05 NOTE — Discharge Instructions (Addendum)
I have attached information on post concussive syndrome.  Please reference this with any questions.  I have also given you referral to neurology if you find your symptoms continue.  I would also recommend that you follow-up with your regular doctor for reevaluation in the next 1 to 2 weeks.  If you develop any new or worsening symptoms that we discussed such as numbness, weakness, worsening headaches, visual changes, increased confusion, please come back to the emergency department for immediate reevaluation.  It was a pleasure to meet you.

## 2020-10-05 NOTE — ED Provider Notes (Signed)
[facility]-EMERGENCY DEPT Provider Note   CSN: [id number] Arrival date & time: 10/05/20  1046     History Chief Complaint  Patient presents with   Headache    Tom Miranda is a 45 y.o. male.  HPI Patient is a 45 year old male with a medical history as noted below.  He presents to the emergency department due to recurrent headaches.  Patient states that 4 days ago he was at the grocery store and a large freezer door fell forward and struck him to his forehead.  He came to the emergency department and was evaluated at that time.  He had a negative CT scan.  He states that since this occurred he has been having daily headaches.  They occur about 4-5 times per day and after taking Tylenol will typically resolve the headache.  States it is bitemporal.  Currently has a 3/10 headache.  Reports associated photophobia as well as decreased appetite.  No visual changes, numbness, weakness, or vomiting.    Past Medical History:  Diagnosis Date   Asthma    GERD (gastroesophageal reflux disease)     Patient Active Problem List   Diagnosis Date Noted   Prediabetes 02/16/2020   Hyperlipidemia 02/16/2020   Joint pain 02/16/2020   GERD (gastroesophageal reflux disease)    Penile pain 08/27/2019   Chest pain 08/18/2019   Obesity (BMI 30.0-34.9) 06/02/2019   Glaucoma 06/02/2019    History reviewed. No pertinent surgical history.     No family history on file.  Social History   Tobacco Use   Smoking status: Never   Smokeless tobacco: Never  Substance Use Topics   Alcohol use: Not Currently   Drug use: No    Home Medications Prior to Admission medications   Medication Sig Start Date End Date Taking? Authorizing Provider  famotidine (PEPCID) 40 MG tablet Take 1 tablet (40 mg total) by mouth daily. 08/18/19   Jacalyn Lefevre, MD  rosuvastatin (CRESTOR) 10 MG tablet Take 1 tablet (10 mg total) by mouth daily. Patient not taking: Reported on 10/01/2020 02/18/20    Derrel Nip, MD    Allergies    Patient has no known allergies.  Review of Systems   Review of Systems  Constitutional:  Positive for appetite change.  Eyes:  Positive for photophobia. Negative for visual disturbance.  Gastrointestinal:  Negative for nausea and vomiting.  Skin:  Negative for wound.  Neurological:  Positive for headaches. Negative for syncope, weakness and numbness.   Physical Exam Updated Vital Signs BP 114/83   Pulse 75   Temp 98 F (36.7 C) (Oral)   Resp 16   SpO2 100%   Physical Exam Vitals and nursing note reviewed.  Constitutional:      General: He is not in acute distress.    Appearance: Normal appearance. He is well-developed. He is not ill-appearing, toxic-appearing or diaphoretic.  HENT:     Head: Normocephalic and atraumatic.     Right Ear: External ear normal.     Left Ear: External ear normal.     Nose: Nose normal.     Mouth/Throat:     Mouth: Mucous membranes are moist.     Pharynx: Oropharynx is clear. No oropharyngeal exudate or posterior oropharyngeal erythema.  Eyes:     Extraocular Movements: Extraocular movements intact.  Cardiovascular:     Rate and Rhythm: Normal rate and regular rhythm.     Pulses: Normal pulses.     Heart sounds: Normal heart sounds.  No murmur heard.   No friction rub. No gallop.  Pulmonary:     Effort: Pulmonary effort is normal. No respiratory distress.     Breath sounds: Normal breath sounds. No stridor. No wheezing, rhonchi or rales.  Abdominal:     General: Abdomen is flat. There is no distension.  Musculoskeletal:        General: Normal range of motion.     Cervical back: Normal range of motion and neck supple. No tenderness.  Skin:    General: Skin is warm and dry.  Neurological:     General: No focal deficit present.     Mental Status: He is alert and oriented to person, place, and time.     GCS: GCS eye subscore is 4. GCS verbal subscore is 5. GCS motor subscore is 6.     Comments: Patient  is oriented to person, place, and time. Patient phonates in clear, complete, and coherent sentences. Negative arm drift. Finger to nose intact bilaterally with no visible signs of dysmetria. Strength is 5/5 in all four extremities. Distal sensation intact in all four extremities.  Ambulatory with a steady gait.  Psychiatric:        Mood and Affect: Mood normal.        Behavior: Behavior normal.   ED Results / Procedures / Treatments   Labs (all labs ordered are listed, but only abnormal results are displayed) Labs Reviewed - No data to display  EKG None  Radiology No results found.  Procedures Procedures   Medications Ordered in ED Medications - No data to display  ED Course  I have reviewed the triage vital signs and the nursing notes.  Pertinent labs & imaging results that were available during my care of the patient were reviewed by me and considered in my medical decision making (see chart for details).    MDM Rules/Calculators/A&P                          Pt is a 45 y.o. male who presents to the emergency department with recurrent headaches.  Physical exam is extremely reassuring.  Neurological exam is benign.  Symptoms seem consistent with a postconcussive syndrome.  He had a negative CT scan initially 4 days ago.  Did not feel that repeat imaging was warranted and patient was agreeable.  Recommended continued use of Tylenol but also recommended ibuprofen use.  We discussed dosing of these medications.  Recommended that he continue to monitor his symptoms closely and follow-up with his regular doctor if they persist.  We discussed symptoms associated with intracranial injury and he knows that if these should develop he needs to come back to the emergency department immediately for reevaluation.  Also gave patient a referral to neurology.  Feel the patient is stable for discharge at this time and he is agreeable.  He verbalized understanding of the above plan.  His questions  were answered and he was amicable at the time of discharge.  Note: Portions of this report may have been transcribed using voice recognition software. Every effort was made to ensure accuracy; however, inadvertent computerized transcription errors may be present.   Final Clinical Impression(s) / ED Diagnoses Final diagnoses:  Postconcussive syndrome   Rx / DC Orders ED Discharge Orders     None        Placido Sou, PA-C 10/05/20 1443    Horton, Clabe Seal, DO 10/08/20 0945

## 2020-10-05 NOTE — ED Triage Notes (Signed)
Pt complains of headache off and on since head injury on 6/19.

## 2020-10-06 ENCOUNTER — Telehealth: Payer: Self-pay | Admitting: *Deleted

## 2020-10-06 NOTE — Telephone Encounter (Signed)
Transition Care Management Unsuccessful Follow-up Telephone Call  Date of discharge and from where:  10/05/2020 - [facility]  Attempts:  1st Attempt  Reason for unsuccessful TCM follow-up call:  Left voice message

## 2020-10-09 NOTE — Telephone Encounter (Signed)
Transition Care Management Unsuccessful Follow-up Telephone Call  Date of discharge and from where:  10/05/2020 - [facility]  Attempts:  2nd Attempt  Reason for unsuccessful TCM follow-up call:  Left voice message

## 2020-10-10 NOTE — Telephone Encounter (Signed)
Transition Care Management Unsuccessful Follow-up Telephone Call  Date of discharge and from where:  10/05/2020 - [facility]  Attempts:  3rd Attempt  Reason for unsuccessful TCM follow-up call:  Left voice message

## 2020-10-19 ENCOUNTER — Other Ambulatory Visit: Payer: Self-pay

## 2020-10-19 ENCOUNTER — Ambulatory Visit (INDEPENDENT_AMBULATORY_CARE_PROVIDER_SITE_OTHER): Payer: Medicaid Other | Admitting: Family Medicine

## 2020-10-19 VITALS — BP 113/73 | HR 86 | Wt 212.2 lb

## 2020-10-19 DIAGNOSIS — S060X0D Concussion without loss of consciousness, subsequent encounter: Secondary | ICD-10-CM | POA: Diagnosis not present

## 2020-10-19 DIAGNOSIS — G44309 Post-traumatic headache, unspecified, not intractable: Secondary | ICD-10-CM | POA: Diagnosis not present

## 2020-10-19 MED ORDER — MAGNESIUM OXIDE 400 MG PO CAPS
1.0000 | ORAL_CAPSULE | Freq: Every day | ORAL | 0 refills | Status: DC | PRN
Start: 2020-10-19 — End: 2023-10-27

## 2020-10-19 MED ORDER — MELATONIN 3 MG PO TABS: 3.0000 mg | ORAL_TABLET | Freq: Every day | ORAL | 0 refills | Status: DC

## 2020-10-19 NOTE — Progress Notes (Signed)
SUBJECTIVE:   CHIEF COMPLAINT / HPI:   Tom Miranda is a 44 yo M who presents for the following:  Headaches Hit in the head on 16 June by a grocery store freezer door but did not lose consciousness.  He sought care at this time in the emergency department and told he has a concussion and normal head imaging and was sent home.  He continues to have headaches. They occur mostly at night and are throbbing in nature he also has issues with sleeping.  States he does not get to sleep until 530 and has to wake up at 730 for work. This is also affecting his concentration and he has also noticed that he is starting to search for his words in conversation.  He has noticed that water will sometimes help his headache.  He also endorses nausea or a slight oily taste that is helped by eating and drinking something sweet.  Light also affects him in a negative way but he also admits light always bothers him as he has a history of glaucoma.  He denies vision changes, vomiting loss of sensation, chest pain, shortness of breath.  PERTINENT  PMH / PSH: GERD, HLD  OBJECTIVE:   BP 113/73   Pulse 86   Wt 212 lb 3.2 oz (96.3 kg)   SpO2 100%   BMI 32.26 kg/m   General: Appears well, no acute distress. Age appropriate. Cardiac: RRR, normal heart sounds, no murmurs Respiratory: CTAB, normal effort Neuro:  CN II: PERRL CN III, IV,VI: EOMI CV V: Normal sensation in V1, V2, V3 CVII: Symmetric smile and brow raise CN VIII: Normal hearing CN IX,X: Symmetric palate raise  CN XI: 5/5 shoulder shrug CN XII: Symmetric tongue protrusion  UE and LE strength 5/5 2+ UE and LE reflexes  Normal sensation in UE and LE bilaterally  Normal gait Psych: normal affect  CT HEAD WITHOUT CONTRAST   TECHNIQUE: Contiguous axial images were obtained from the base of the skull through the vertex without intravenous contrast.   COMPARISON:  None.   FINDINGS: Brain: There is no mass, hemorrhage or extra-axial collection.  The size and configuration of the ventricles and extra-axial CSF spaces are normal. The brain parenchyma is normal, without acute or chronic infarction.   Vascular: No abnormal hyperdensity of the major intracranial arteries or dural venous sinuses. No intracranial atherosclerosis.   Skull: The visualized skull base, calvarium and extracranial soft tissues are normal.   Sinuses/Orbits: No fluid levels or advanced mucosal thickening of the visualized paranasal sinuses. No mastoid or middle ear effusion. The orbits are normal.   IMPRESSION: Normal head CT.    ASSESSMENT/PLAN:   Concussion with no loss of consciousness Initial head injury by freezer door 09/28/2020 has been seen in ED twice.  Normal head CT at time of injury and was reviewed by me today.  Neuro exam is grossly normal.  Patient endorsing issues with sleep, headache, word finding.  Has neurology follow-up.  Discussed extensively taking time off from work for brain to relax and rest and patient voiced understanding.  ED precautions also discussed such as symptoms of intracranial pressure.  Encouraged him to keep follow-up with neurology. - Follow-up with neurology 11/2020 - Follow-up as needed  Post-concussion headache Endorsing brain fog and headaches towards the end of day that is disturbing sleep.  No other concerning neurological findings at this time.  CT within normal limits.  Discussed headache management as well as sleep management.  Also discussed  that headaches can last anywhere from 3 to 4 weeks postconcussion.  Would benefit patient to take time off work to allow brain rest. -Take tylenol and ibuprofen as needed -You may also try mag-ox in between aleve and tylenol -Discussed possible benefits of fish oil and DHA  -Melatonin Rx to pharmacy per patient request -Follow-up in 1 month with PCP or sooner if needed     Lavonda Jumbo, DO [facility]

## 2020-10-19 NOTE — Patient Instructions (Signed)
You have a concussion. Most of these resolve within 3 weeks but symptoms can last longer than this. Take tylenol as needed as first line medication for headache. Take ibuprofen only if necessary beyond this. You may also try mag-ox in between aleve and tylenol While nausea, fatigue, blurred vision are common in concussion, if you develop persistent vomiting, weakness or numbness in arms/legs, loss of vision, worsening confusion (all of these are unusual in concussion), call 911. Mental and physical rest are important. After a short period, light cardio (stationary bike, walking, light jogging) may be beneficial as long as it does not worsen your symptoms. Do not do any activities that put you at risk of getting struck in the head. Some physicians advocate supplements (fish oil, DHA, melatonin) for concussion but this is based on animal models (mice) and there are no human studies to support using these as of yet. Follow up with PCP in 1 month or sooner if needed.  Dr. Salvadore Dom

## 2020-10-20 DIAGNOSIS — G44309 Post-traumatic headache, unspecified, not intractable: Secondary | ICD-10-CM | POA: Insufficient documentation

## 2020-10-20 DIAGNOSIS — S060X0A Concussion without loss of consciousness, initial encounter: Secondary | ICD-10-CM | POA: Insufficient documentation

## 2020-10-20 NOTE — Assessment & Plan Note (Addendum)
Initial head injury by freezer door 09/28/2020 has been seen in ED twice.  Normal head CT at time of injury and was reviewed by me today.  Neuro exam is grossly normal.  Patient endorsing issues with sleep, headache, word finding.  Has neurology follow-up.  Discussed extensively taking time off from work for brain to relax and rest and patient voiced understanding.  ED precautions also discussed such as symptoms of intracranial pressure.  Encouraged him to keep follow-up with neurology. - Follow-up with neurology 11/2020 - Follow-up as needed

## 2020-10-20 NOTE — Assessment & Plan Note (Signed)
Endorsing brain fog and headaches towards the end of day that is disturbing sleep.  No other concerning neurological findings at this time.  CT within normal limits.  Discussed headache management as well as sleep management.  Also discussed that headaches can last anywhere from 3 to 4 weeks postconcussion.  Would benefit patient to take time off work to allow brain rest. -Take tylenol and ibuprofen as needed -You may also try mag-ox in between aleve and tylenol -Discussed possible benefits of fish oil and DHA  -Melatonin Rx to pharmacy per patient request -Follow-up in 1 month with PCP or sooner if needed

## 2020-11-13 ENCOUNTER — Other Ambulatory Visit: Payer: Self-pay | Admitting: Family Medicine

## 2020-11-13 DIAGNOSIS — S060X0D Concussion without loss of consciousness, subsequent encounter: Secondary | ICD-10-CM

## 2020-11-20 NOTE — Progress Notes (Deleted)
Cardiology Office Note:    Date:  11/20/2020   ID:  Tom Miranda, DOB 02/20/1976, MRN [id number]  PCP:  Derrel Nip, MD  Cardiologist:  None   Referring MD: Carney Living, *   No chief complaint on file.   History of Present Illness:    Tom Miranda is a 45 y.o. male with a hx of chest pain who is referred for clinical evaluation.  Risk factors include prediabetes, obesity, and significant hyperlipidemia (LDL 153 February 2021).  ***  Past Medical History:  Diagnosis Date   Asthma    GERD (gastroesophageal reflux disease)     No past surgical history on file.  Current Medications: No outpatient medications have been marked as taking for the 11/21/20 encounter (Appointment) with Lyn Records, MD.     Allergies:   Patient has no known allergies.   Social History   Socioeconomic History   Marital status: Single    Spouse name: Not on file   Number of children: Not on file   Years of education: Not on file   Highest education level: Not on file  Occupational History   Not on file  Tobacco Use   Smoking status: Never   Smokeless tobacco: Never  Substance and Sexual Activity   Alcohol use: Not Currently   Drug use: No   Sexual activity: Not on file  Other Topics Concern   Not on file  Social History Narrative   Not on file   Social Determinants of Health   Financial Resource Strain: Not on file  Food Insecurity: Not on file  Transportation Needs: Not on file  Physical Activity: Not on file  Stress: Not on file  Social Connections: Not on file     Family History: The patient's family history is not on file.  ROS:   Please see the history of present illness.    *** All other systems reviewed and are negative.  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  Recent Labs: No results found for requested labs within last 8760 hours.  Recent Lipid Panel    Component Value Date/Time   CHOL 214 (H) 06/09/2019 1546   TRIG 103 06/09/2019 1546   HDL 42 06/09/2019 1546   CHOLHDL 5.1 (H) 06/09/2019 1546   LDLCALC 153 (H) 06/09/2019 1546   LDLDIRECT 163 (H) 02/15/2020 1642    Physical Exam:    VS:  There were no vitals taken for this visit.    Wt Readings from Last 3 Encounters:  10/19/20 212 lb 3.2 oz (96.3 kg)  10/01/20 213 lb 13.5 oz (97 kg)  08/21/20 214 lb 9.6 oz (97.3 kg)     GEN: ***. No acute distress HEENT: Normal NECK: No JVD. LYMPHATICS: No lymphadenopathy CARDIAC: *** murmur. RRR *** gallop, or edema. VASCULAR: *** Normal Pulses. No bruits. RESPIRATORY:  Clear to auscultation without rales, wheezing or rhonchi  ABDOMEN: Soft, non-tender, non-distended, No pulsatile mass, MUSCULOSKELETAL: No deformity  SKIN: Warm and dry NEUROLOGIC:  Alert and oriented x 3 PSYCHIATRIC:  Normal affect   ASSESSMENT:    1. Chest pain of uncertain etiology   2. Hyperlipidemia LDL goal <70   3. Obesity (BMI 30.0-34.9)   4. Prediabetes   5. Gastroesophageal reflux disease without esophagitis    PLAN:    In order  of problems listed above:  ***   Medication Adjustments/Labs and Tests Ordered: Current medicines are reviewed at length with the patient today.  Concerns regarding medicines are outlined above.  No orders of the defined types were placed in this encounter.  No orders of the defined types were placed in this encounter.   There are no Patient Instructions on file for this visit.   Signed, Lesleigh Noe, MD  11/20/2020 7:32 AM    [facility]

## 2020-11-21 ENCOUNTER — Ambulatory Visit: Payer: Medicaid Other | Admitting: Interventional Cardiology

## 2020-11-21 DIAGNOSIS — R079 Chest pain, unspecified: Secondary | ICD-10-CM

## 2020-11-21 DIAGNOSIS — E785 Hyperlipidemia, unspecified: Secondary | ICD-10-CM

## 2020-11-21 DIAGNOSIS — E669 Obesity, unspecified: Secondary | ICD-10-CM

## 2020-11-21 DIAGNOSIS — R7303 Prediabetes: Secondary | ICD-10-CM

## 2020-11-21 DIAGNOSIS — K219 Gastro-esophageal reflux disease without esophagitis: Secondary | ICD-10-CM

## 2021-01-02 ENCOUNTER — Ambulatory Visit: Payer: Medicaid Other | Admitting: Neurology

## 2021-01-03 ENCOUNTER — Ambulatory Visit: Payer: Medicaid Other | Admitting: Neurology

## 2021-01-05 ENCOUNTER — Other Ambulatory Visit: Payer: Self-pay

## 2021-01-05 MED ORDER — FAMOTIDINE 40 MG PO TABS: 40.0000 mg | ORAL_TABLET | Freq: Every day | ORAL | 0 refills | Status: DC

## 2021-01-11 ENCOUNTER — Encounter: Payer: Self-pay | Admitting: Neurology

## 2021-01-11 ENCOUNTER — Ambulatory Visit: Payer: Medicaid Other | Admitting: Neurology

## 2021-01-25 NOTE — Progress Notes (Signed)
Referring-Marshall Chambliss, MD Reason for referral-chest pain  HPI: 45 year old male for evaluation of chest pain at request of Pearlean Brownie and MD. Patient has a long history of reflux.  However recently his symptoms have worsened and he is concerned they may be cardiac related.  He describes pain in his chest and left shoulder that can be intermittent most of the day.  It is not related to food.  Some increase with lying flat and occasional waterbrash.  No associated symptoms.  He does not have exertional chest pain.  He denies dyspnea on exertion, orthopnea, PND or pedal edema.  Cardiology now asked to evaluate.  Current Outpatient Medications  Medication Sig Dispense Refill   [facility] MELATONIN 3 MG TABS tablet TAKE 1 TABLET BY MOUTH AT BEDTIME. 30 tablet 0   famotidine (PEPCID) 40 MG tablet TAKE 1 TABLET BY MOUTH EVERY DAY 30 tablet 0   Magnesium Oxide 400 MG CAPS Take 1 capsule (400 mg total) by mouth daily as needed (can take daily for 3-4 days then daily as needed for headache). 30 capsule 0   rosuvastatin (CRESTOR) 10 MG tablet Take 1 tablet (10 mg total) by mouth daily. 90 tablet 3   No current facility-administered medications for this visit.    No Known Allergies   Past Medical History:  Diagnosis Date   Asthma    GERD (gastroesophageal reflux disease)    Hyperlipidemia     Past Surgical History:  Procedure Laterality Date   No prior surgery      Social History   Socioeconomic History   Marital status: Single    Spouse name: Not on file   Number of children: 7   Years of education: Not on file   Highest education level: Not on file  Occupational History   Not on file  Tobacco Use   Smoking status: Former    Types: Cigarettes   Smokeless tobacco: Never  Substance and Sexual Activity   Alcohol use: Yes    Comment: Occasional   Drug use: No   Sexual activity: Not on file  Other Topics Concern   Not on file  Social History Narrative   Not on file    Social Determinants of Health   Financial Resource Strain: Not on file  Food Insecurity: Not on file  Transportation Needs: Not on file  Physical Activity: Not on file  Stress: Not on file  Social Connections: Not on file  Intimate Partner Violence: Not on file    Family History  Problem Relation Age of Onset   Glaucoma Mother     ROS: no fevers or chills, productive cough, hemoptysis, dysphasia, odynophagia, melena, hematochezia, dysuria, hematuria, rash, seizure activity, orthopnea, PND, pedal edema, claudication. Remaining systems are negative.  Physical Exam:   Blood pressure 140/62, pulse 84, height 5\' 8"  (1.727 m), weight 214 lb 6.4 oz (97.3 kg), SpO2 98 %.  General:  Well developed/well nourished in NAD Skin warm/dry Patient not depressed No peripheral clubbing Back-normal HEENT-normal/normal eyelids Neck supple/normal carotid upstroke bilaterally; no bruits; no JVD; no thyromegaly chest - CTA/ normal expansion CV - RRR/normal S1 and S2; no murmurs, rubs or gallops;  PMI nondisplaced Abdomen -NT/ND, no HSM, no mass, + bowel sounds, no bruit 2+ femoral pulses, no bruits Ext-no edema, chords, 2+ DP Neuro-grossly nonfocal  ECG - NSR, nonspecific ST changes; personally reviewed  A/P  1 chest pain-symptoms are atypical and likely reflux related.  However he is extremely concerned about his symptoms.  I will arrange a cardiac CTA to exclude obstructive coronary disease.  If negative then he will follow-up with primary care and may need gastroenterology evaluation in the future if his symptoms persist.  2 hyperlipidemia-continue statin.  Check lipids and liver.  3 gastroesophageal reflux disease-we will continue Pepcid.  If CTA negative he will follow-up with primary care and may need GI evaluation as outlined above.  Olga Millers, MD

## 2021-01-29 ENCOUNTER — Other Ambulatory Visit: Payer: Self-pay | Admitting: Family Medicine

## 2021-01-31 ENCOUNTER — Ambulatory Visit: Payer: Medicaid Other | Admitting: Cardiology

## 2021-01-31 ENCOUNTER — Encounter: Payer: Self-pay | Admitting: Cardiology

## 2021-01-31 ENCOUNTER — Other Ambulatory Visit: Payer: Self-pay

## 2021-01-31 VITALS — BP 140/62 | HR 84 | Ht 68.0 in | Wt 214.4 lb

## 2021-01-31 DIAGNOSIS — R072 Precordial pain: Secondary | ICD-10-CM

## 2021-01-31 DIAGNOSIS — E7800 Pure hypercholesterolemia, unspecified: Secondary | ICD-10-CM | POA: Diagnosis not present

## 2021-01-31 MED ORDER — METOPROLOL TARTRATE 100 MG PO TABS: ORAL_TABLET | ORAL | 0 refills | Status: DC

## 2021-01-31 NOTE — Patient Instructions (Signed)
Medication Instructions:  CONTINUE SAME MEDICATIONS *If you need a refill on your cardiac medications before your next appointment, please call your pharmacy*   Lab Work: FASTING BLOOD WORK If you have labs (blood work) drawn today and your tests are completely normal, you will receive your results only by: MyChart Message (if you have MyChart) OR A paper copy in the mail If you have any lab test that is abnormal or we need to change your treatment, we will call you to review the results.   Testing/Procedures:  Your cardiac CT will be scheduled at one of the below locations:   [facility] [address] [place], [place] [address] [phone]   If scheduled at [facility], please arrive at the [facility] main entrance (entrance A) of [facility] 30 minutes prior to test start time. You can use the FREE valet parking offered at the main entrance (encouraged to control the heart rate for the test) Proceed to the [facility] Radiology Department (first floor) to check-in and test prep.   Please follow these instructions carefully (unless otherwise directed):  Hold all erectile dysfunction medications at least 3 days (72 hrs) prior to test.  On the Night Before the Test: Be sure to Drink plenty of water. Do not consume any caffeinated/decaffeinated beverages or chocolate 12 hours prior to your test. Do not take any antihistamines 12 hours prior to your test.  On the Day of the Test: Drink plenty of water until 1 hour prior to the test. Do not eat any food 4 hours prior to the test. You may take your regular medications prior to the test.  Take metoprolol (Lopressor) 1OO mg two hours prior to test.       After the Test: Drink plenty of water. After receiving IV contrast, you may experience a mild flushed feeling. This is normal. On occasion, you may experience a mild rash up to 24 hours after the test. This is not dangerous. If this occurs, you  can take Benadryl 25 mg and increase your fluid intake. If you experience trouble breathing, this can be serious. If it is severe call 911 IMMEDIATELY. If it is mild, please call our office. If you take any of these medications: Glipizide/Metformin, Avandament, Glucavance, please do not take 48 hours after completing test unless otherwise instructed.  Please allow 2-4 weeks for scheduling of routine cardiac CTs. Some insurance companies require a pre-authorization which may delay scheduling of this test.   For non-scheduling related questions, please contact the cardiac imaging nurse navigator should you have any questions/concerns: Rockwell Alexandria, Cardiac Imaging Nurse Navigator Larey Brick, Cardiac Imaging Nurse Navigator [facility] Direct Office Dial: [phone]   For scheduling needs, including cancellations and rescheduling, please call [place], [phone].   Follow-Up: At [facility], you and your health needs are our priority.  As part of our continuing mission to provide you with exceptional heart care, we have created designated Provider Care Teams.  These Care Teams include your primary Cardiologist (physician) and Advanced Practice Providers (APPs -  Physician Assistants and Nurse Practitioners) who all work together to provide you with the care you need, when you need it.  We recommend signing up for the patient portal called "MyChart".  Sign up information is provided on this After Visit Summary.  MyChart is used to connect with patients for Virtual Visits (Telemedicine).  Patients are able to view lab/test results, encounter notes, upcoming appointments, etc.  Non-urgent messages can be sent to your provider as well.   To learn more about what you can do with MyChart, go to [email or web address].    Your next appointment:    AS NEEDED  The format for your next appointment:   In Person  Provider:   Olga Millers, MD

## 2021-02-07 DIAGNOSIS — R072 Precordial pain: Secondary | ICD-10-CM | POA: Diagnosis not present

## 2021-02-07 DIAGNOSIS — E7800 Pure hypercholesterolemia, unspecified: Secondary | ICD-10-CM | POA: Diagnosis not present

## 2021-02-08 ENCOUNTER — Telehealth (HOSPITAL_COMMUNITY): Payer: Self-pay | Admitting: *Deleted

## 2021-02-08 ENCOUNTER — Encounter: Payer: Self-pay | Admitting: *Deleted

## 2021-02-08 ENCOUNTER — Telehealth: Payer: Self-pay | Admitting: *Deleted

## 2021-02-08 LAB — BASIC METABOLIC PANEL
BUN/Creatinine Ratio: 10 (ref 9–20)
BUN: 11 mg/dL (ref 6–24)
CO2: 25 mmol/L (ref 20–29)
Calcium: 10.1 mg/dL (ref 8.7–10.2)
Chloride: 97 mmol/L (ref 96–106)
Creatinine, Ser: 1.13 mg/dL (ref 0.76–1.27)
Glucose: 90 mg/dL (ref 70–99)
Potassium: 4.7 mmol/L (ref 3.5–5.2)
Sodium: 137 mmol/L (ref 134–144)
eGFR: 83 mL/min/{1.73_m2} (ref >59)

## 2021-02-08 LAB — LIPID PANEL
Chol/HDL Ratio: 6.2 ratio — ABNORMAL HIGH (ref 0.0–5.0)
Cholesterol, Total: 240 mg/dL — ABNORMAL HIGH (ref 100–199)
HDL: 39 mg/dL — ABNORMAL LOW (ref >39)
LDL Chol Calc (NIH): 184 mg/dL — ABNORMAL HIGH (ref 0–99)
Triglycerides: 98 mg/dL (ref 0–149)
VLDL Cholesterol Cal: 17 mg/dL (ref 5–40)

## 2021-02-08 LAB — HEPATIC FUNCTION PANEL
ALT: 22 IU/L (ref 0–44)
AST: 26 IU/L (ref 0–40)
Albumin: 4.9 g/dL (ref 4.0–5.0)
Alkaline Phosphatase: 71 IU/L (ref 44–121)
Bilirubin Total: 0.5 mg/dL (ref 0.0–1.2)
Bilirubin, Direct: 0.11 mg/dL (ref 0.00–0.40)
Total Protein: 7.8 g/dL (ref 6.0–8.5)

## 2021-02-08 NOTE — Telephone Encounter (Signed)
Spoke with pt, he reports he is not taking crestor and stopped it after he got it because he has a family member that had problems with the crestor, so he decided not to take it. He would like a copy mailed to him and then he will call back to let us know if he would like to start anything.

## 2021-02-08 NOTE — Telephone Encounter (Signed)
-----   Message from [name], MD sent at 02/08/2021  7:14 AM EDT ----- Change crestor to 40 mg daily; lipids and liver 8 weeks Olga Millers

## 2021-02-08 NOTE — Telephone Encounter (Signed)
Reaching out to patient to offer assistance regarding upcoming cardiac imaging study; pt verbalizes understanding of appt date/time, parking situation and where to check in, pre-test NPO status and medications ordered, and verified current allergies; name and call back number provided for further questions should they arise  Larey Brick RN Navigator Cardiac Imaging [facility] [phone] office [phone] cell  Patient to take 100mg  metoprolol tartrate two hours prior to cardiac CT scan.  He reports he is a difficult IV start.

## 2021-02-09 ENCOUNTER — Ambulatory Visit (HOSPITAL_COMMUNITY): Admission: RE | Admit: 2021-02-09 | Payer: Medicaid Other | Source: Ambulatory Visit

## 2021-02-11 ENCOUNTER — Other Ambulatory Visit: Payer: Self-pay | Admitting: Family Medicine

## 2021-02-12 ENCOUNTER — Other Ambulatory Visit: Payer: Self-pay

## 2021-02-12 ENCOUNTER — Ambulatory Visit: Payer: Medicaid Other | Admitting: Psychiatry

## 2021-02-12 ENCOUNTER — Encounter: Payer: Self-pay | Admitting: Psychiatry

## 2021-02-12 VITALS — BP 122/76 | HR 41 | Ht 68.0 in | Wt 209.0 lb

## 2021-02-12 DIAGNOSIS — R200 Anesthesia of skin: Secondary | ICD-10-CM

## 2021-02-12 DIAGNOSIS — F0781 Postconcussional syndrome: Secondary | ICD-10-CM | POA: Diagnosis not present

## 2021-02-12 MED ORDER — GABAPENTIN 300 MG PO CAPS: ORAL_CAPSULE | ORAL | 3 refills | Status: DC

## 2021-02-12 NOTE — Progress Notes (Signed)
Referring:  Placido Sou, PA-C [address] [place],  [place] [address]  PCP: Derrel Nip, MD  Neurology was asked to evaluate Tom Miranda, a 45 year old male for a chief complaint of headaches.  Our recommendations of care will be communicated by shared medical record.    CC:  headaches  HPI:  Medical co-morbidities: glaucoma, GERD  The patient presents for evaluation of headaches following a concussion on September 28, 2020. He hit his head on a grocery store freezer which knocked him down to the floor. Did not lose consciousness. Presented to the ED where [facility] was unremarkable. Initial headache was only mild, but then it worsened throughout the week. Currently he has has 2 mild headaches per day on most days. Headaches are worse at night and can get up to 8/10 severity. Has trouble sleeping due to the headaches. He does have mild neck pain as well.  Also notes difficulty with concentration and word finding difficulty. Feels sentences need to be repeated multiple times before he can grasp them. Feels easily frustrated and irritable.  Over the past few weeks has noticed numbness/tingling in his left arm and leg. This is new for him. No weakness or shooting pains.  He has noticed increased chest pain and is currently being evaluated for CAD. Planned to have a CTA chest next Monday.  Headache History: Onset: June 2022 Triggers: bright lights Aura: no Location: frontal Quality/Description: throbbing Severity: 3-8/10 Associated Symptoms:  Photophobia: yes  Phonophobia: no  Nausea: no Worse with activity?: yes Duration of headaches: 15 minutes during the day, several hours at night  Headache days per month: 30 Headache free days per month: 0  Current Treatment: Abortive Ibuprofen Tylenol - helps  Preventative no  Prior Therapies                                 none   Headache Risk Factors: Headache risk factors and/or co-morbidities (+) Neck Pain (+) Sleep  Disorder (+) Obesity  Body mass index is 31.78 kg/m. (+) History of Traumatic Brain Injury and/or Concussion  LABS: CBC    Component Value Date/Time   WBC 6.8 08/18/2019 1238   RBC 5.23 08/18/2019 1238   HGB 15.1 08/18/2019 1238   HCT 46.7 08/18/2019 1238   PLT 283 08/18/2019 1238   MCV 89.3 08/18/2019 1238   MCH 28.9 08/18/2019 1238   MCHC 32.3 08/18/2019 1238   RDW 13.3 08/18/2019 1238   LYMPHSABS 3.1 11/01/2007 2237   MONOABS 1.5 (H) 11/01/2007 2237   EOSABS 0.0 11/01/2007 2237   BASOSABS 0.0 11/01/2007 2237   CMP Latest Ref Rng & Units 02/07/2021 08/18/2019 07/22/2019  Glucose 70 - 99 mg/dL 90 97 381(O)  BUN 6 - 24 mg/dL 11 9 13   Creatinine 0.76 - 1.27 mg/dL 1.75 1.02)  Sodium 134 - 144 mmol/L 137 137 140  Potassium 3.5 - 5.2 mmol/L 4.7 4.1 2.7(LL)  Chloride 96 - 106 mmol/L 97 101 103  CO2 20 - 29 mmol/L 25 26 20(L)  Calcium 8.7 - 10.2 mg/dL 5.85(I 9.6 9.5  Total Protein 6.0 - 8.5 g/dL 7.8 7.6 7.4  Total Bilirubin 0.0 - 1.2 mg/dL 0.5 0.7 0.7  Alkaline Phos 44 - 121 IU/L 71 58 46  AST 0 - 40 IU/L 26 42(H) 33  ALT 0 - 44 IU/L 22 30 21      IMAGING:  [facility] 10/01/20: unremarkable  Imaging independently  reviewed on February 12, 2021   Current Outpatient Medications on File Prior to Visit  Medication Sig Dispense Refill   [facility] MELATONIN 3 MG TABS tablet TAKE 1 TABLET BY MOUTH AT BEDTIME. 30 tablet 0   famotidine (PEPCID) 40 MG tablet TAKE 1 TABLET BY MOUTH EVERY DAY 30 tablet 0   Magnesium Oxide 400 MG CAPS Take 1 capsule (400 mg total) by mouth daily as needed (can take daily for 3-4 days then daily as needed for headache). 30 capsule 0   No current facility-administered medications on file prior to visit.     Allergies: No Known Allergies  Family History: Migraine or other headaches in the family:  children have migraines Aneurysms in a first degree relative:  no Brain tumors in the family:  no Other neurological illness in the family:   brother needed brain  surgery but he is unsure why  Past Medical History: Past Medical History:  Diagnosis Date   Asthma    GERD (gastroesophageal reflux disease)    Hyperlipidemia     Past Surgical History Past Surgical History:  Procedure Laterality Date   No prior surgery      Social History: Social History   Tobacco Use   Smoking status: Former    Types: Cigarettes   Smokeless tobacco: Never  Substance Use Topics   Alcohol use: Yes    Comment: Occasional   Drug use: No    ROS: Negative for fevers, chills. Positive for headaches, irritability, insomnia, concentration difficulty. All other systems reviewed and negative unless stated otherwise in HPI.   Physical Exam:   Vital Signs: BP 122/76   Pulse (!) 41   Ht 5\' 8"  (1.727 m)   Wt 209 lb (94.8 kg)   SpO2 98%   BMI 31.78 kg/m  GENERAL: well appearing,in no acute distress,alert SKIN:  Color, texture, turgor normal. No rashes or lesions HEAD:  Normocephalic/atraumatic. CV:  RRR RESP: Normal respiratory effort MSK: no tenderness to palpation over occiput, neck, or shoulders  NEUROLOGICAL: Mental Status: Alert, oriented to person, place and time,Follows commands Cranial Nerves: PERRL,visual fields intact to confrontation OD, unable to finger count in any quadrants OS,extraocular movements intact,facial sensation intact,no facial droop or ptosis,hearing intact to finger rub bilaterally,no dysarthria,palate elevate symmetrically,tongue protrudes midline,shoulder shrug intact and symmetric Motor: muscle strength 5/5 both upper and lower extremities,no drift, normal tone Reflexes: 2+ throughout Sensation: diminished sensation to light touch LUE, otherwise sensation intact to light touch throughout Coordination: Finger-to- nose-finger intact bilaterally, Gait: normal-based   IMPRESSION: 45 year old male with a history of glaucoma and GERD who presents for evaluation of post-concussive syndrome. He is having persistent headaches and  memory difficulty 4 months after his concussion. Exam significant for decreased vision in the left eye and diminished sensation in LUE. Will order MRI brain for persistent and worsening neurologic symptoms following concussion. He will also make an appointment with ophtho today to evaluate the vision loss in his left eye. Will start gabapentin for headache prevention. If coronary CTA is clear can consider starting a triptan as needed, otherwise can start a gepant for rescue. Will refer to neck PT and speech therapy for cognitive rehab.  PLAN: -MRI brain -Preventive: Start gabapentin 300 mg QHS, uptitrate to 900 mg QHS. Can increase further if needed  -Referral to neck PT and cognitive rehab -Follow up with ophthalmology for vision loss in left eye -Next steps: consider triptan or gepant as needed based on coronary artery CTA   I  spent a total of 45 minutes chart reviewing and counseling the patient. Headache education was done. Discussed treatment options including preventive and acute medications, natural supplements, and physical therapy. Discussed medication side effects, adverse reactions and drug interactions. Written educational materials and patient instructions outlining all of the above were given.  Follow-up: 3 months   Ocie Doyne, MD 02/12/2021   11:43 AM

## 2021-02-12 NOTE — Patient Instructions (Addendum)
Speech Therapy for cognitive rehab Start gabapentin for headache prevention. Take one pill at bedtime for one week, then take 2 pills at bedtime for one week, then take 3 pills at bedtime Physical therapy for the neck Let me know when heart CT comes back and I can send in an as-needed medication MRI brain Follow up with ophthalmology for decreased vision in left eye  Post Concussive Syndrome:  Post-concussion syndrome is a complex disorder in which various symptoms -- such as headaches and dizziness -- last for weeks and sometimes months after the injury that caused the concussion. Concussion is a mild traumatic brain injury, usually occurring after a blow to the head. Loss of consciousness isn't required for a diagnosis of concussion or post-concussion syndrome. In fact, the risk of post-concussion syndrome doesn't appear to be associated with the severity of the initial injury. In most people, post-concussion syndrome symptoms occur within the first seven to 10 days and go away within three months, though they can persist for a year or more. Post-concussion syndrome treatments are aimed at easing specific symptoms.  Post-concussion symptoms include: Headaches  Dizziness  Fatigue  Irritability  Anxiety  Insomnia  Loss of concentration and memory  Noise and light sensitivity  Headaches that occur after a concussion can vary and may feel like tension-type headaches or migraines. Most, however, are tension-type headaches, which may be associated with a neck injury that happened at the same time as the head injury. In some cases, people experience behavior or emotional changes after a mild traumatic brain injury. Family members may notice that the person has become more irritable, suspicious, argumentative or stubborn. When to see a doctor See a doctor if you experience a head injury severe enough to cause confusion or amnesia -- even if you never lost consciousness. If a concussion occurs  while you're playing a sport, don't go back in the game. Seek medical attention so that you don't risk worsening your injury. Causes: Some experts believe post-concussion symptoms are caused by structural damage to the brain or disruption of neurotransmitter systems, resulting from the impact that caused the concussion. Others believe post-concussion symptoms are related to psychological factors, especially since the most common symptoms -- headache, dizziness and sleep problems -- are similar to those often experienced by people diagnosed with depression, anxiety or post-traumatic stress disorder. In many cases, both physiological effects of brain trauma and emotional reactions to these effects play a role in the development of symptoms. Researchers haven't determined why some people who've had concussions develop persistent post-concussion symptoms while others do not. No proven correlation between the severity of the injury and the likelihood of developing persistent post-concussion symptoms exists.  Risk Factors: Risk factors for developing post-concussion syndrome include: Age. Studies have found increasing age to be a risk factor for post-concussion syndrome.  Sex. Women are more likely to be diagnosed with post-concussion syndrome, but this may be because women are generally more likely to seek medical care.  Trauma. Concussions resulting from car collisions, falls, assaults and sports injuries are commonly associated with post-concussion syndrome. Treatment: There is no specific treatment for post-concussion syndrome. Instead, your doctor will treat the individual symptoms you're experiencing. The types of symptoms and their frequency are unique to each person. Headaches Medications commonly used for migraines or tension headaches, including some antidepressants, appear to be effective when these types of headaches are associated with post-concussion syndrome. Examples  include: Amitriptyline. This medication has been widely used for post-traumatic injuries, as well  as for symptoms commonly associated with post-concussion syndrome, such as irritability, dizziness and depression. Topiramate. Commonly used to treat migraines, topiramate (Qudexy XR, Topamax, Trokendi XR) may be effective in reducing headaches after head injury. Common side effects of topiramate include weight loss and cognitive problems.  Gabapentin. Gabapentin (Gralise, Neurontin) is frequently used to treat a variety of types of pain and may be helpful in treating post-traumatic headaches. A common side effect of gabapentin is drowsiness.  Other agents used to treat migraines and tension-type headaches may also be helpful in some individuals. Keep in mind that the overuse of over-the-counter and prescription pain relievers may contribute to persistent post-concussion headaches. Memory and thinking problems No medications are currently recommended specifically for the treatment of cognitive problems after mild traumatic brain injury. Time may be the best therapy for post-concussion syndrome if you have cognitive problems, as most of them go away on their own in the weeks to months following the injury. Certain forms of cognitive therapy may be helpful, including focused rehabilitation that provides training in how to use a pocket calendar, Education officer, community or other techniques to work around memory deficits and attention skills. Relaxation therapy also may help. Dizziness Vestibular rehab (a specialized form of physical therapy can help this. Depression and anxiety The symptoms of post-concussion syndrome often improve after the affected person learns that there is a cause for his or her symptoms and that they will likely improve with time. Education about the disorder can ease a person's fears and help provide peace of mind. If you're experiencing new or increasing depression or anxiety after a  concussion, some treatment options include: Psychotherapy. It may be helpful to discuss your concerns with a psychologist or psychiatrist who has experience in working with people with brain injury.  Medication. To combat anxiety or depression, antidepressants or anti-anxiety medications may be prescribed. Prevention: The only known way to prevent post-concussion syndrome is to avoid the head injury in the first place. Avoiding head injuries Although you can't prepare for every potential situation, here are some tips for avoiding common causes of head injuries: Fasten your seat belt whenever you're traveling in a car, and be sure children are in age-appropriate safety seats. Children under 13 are safest riding in the back seat, especially if your car has air bags.  Use helmets whenever you or your children are bicycling, roller-skating, in-line skating, ice-skating, skiing, snowboarding, playing football, batting or running the bases in softball or baseball, skateboarding, or horseback riding. Wear a helmet when riding a motorcycle.  Take steps around the house to prevent falls, such as removing small area rugs, improving lighting and installing handrails.

## 2021-02-13 ENCOUNTER — Telehealth: Payer: Self-pay | Admitting: Psychiatry

## 2021-02-13 NOTE — Telephone Encounter (Signed)
[facility] healthy blue Berkley Harvey: [id number] (exp. 02/13/21 to 04/13/21) order sent to GI, they will reach out to the patient to schdule.

## 2021-02-16 ENCOUNTER — Telehealth (HOSPITAL_COMMUNITY): Payer: Self-pay | Admitting: Emergency Medicine

## 2021-02-16 NOTE — Telephone Encounter (Signed)
Reaching out to patient to offer assistance regarding upcoming cardiac imaging study; pt verbalizes understanding of appt date/time, parking situation and where to check in, pre-test NPO status and medications ordered, and verified current allergies; name and call back number provided for further questions should they arise Rockwell Alexandria RN Navigator Cardiac Imaging [facility] [phone] office [phone] cell  No meds- HR 41 last ov

## 2021-02-19 ENCOUNTER — Ambulatory Visit (HOSPITAL_COMMUNITY)
Admission: RE | Admit: 2021-02-19 | Discharge: 2021-02-19 | Disposition: A | Discharge status: Home / Self Care | Payer: Medicaid Other | Source: Ambulatory Visit | Attending: Cardiology | Admitting: Cardiology

## 2021-02-19 DIAGNOSIS — R072 Precordial pain: Secondary | ICD-10-CM | POA: Diagnosis not present

## 2021-02-19 DIAGNOSIS — E7800 Pure hypercholesterolemia, unspecified: Secondary | ICD-10-CM | POA: Insufficient documentation

## 2021-02-19 MED ORDER — NITROGLYCERIN 0.4 MG SL SUBL
SUBLINGUAL_TABLET | SUBLINGUAL | Status: AC
  Filled 2021-02-19: qty 2

## 2021-02-19 MED ORDER — METOPROLOL TARTRATE 5 MG/5ML IV SOLN: 10.0000 mg | Freq: Once | INTRAVENOUS | Status: AC

## 2021-02-19 MED ORDER — NITROGLYCERIN 0.4 MG SL SUBL
0.8000 mg | SUBLINGUAL_TABLET | Freq: Once | SUBLINGUAL | Status: AC
  Administered 2021-02-19: 0.8 mg via SUBLINGUAL

## 2021-02-19 MED ORDER — METOPROLOL TARTRATE 5 MG/5ML IV SOLN
INTRAVENOUS | Status: AC
  Administered 2021-02-19: 10 mg via INTRAVENOUS
  Filled 2021-02-19: qty 10

## 2021-02-19 MED ORDER — IOHEXOL 350 MG/ML SOLN
95.0000 mL | Freq: Once | INTRAVENOUS | Status: AC | PRN
  Administered 2021-02-19: 95 mL via INTRAVENOUS

## 2021-02-19 NOTE — Progress Notes (Signed)
After CT patient had episode of emesis. Felt better after. Drank ginger ale and ate chips after.. Vital signs stable encourage to drink water throughout day.Reasons explained and verbalized understanding. Ambulated steady gait.

## 2021-02-21 ENCOUNTER — Telehealth: Payer: Self-pay | Admitting: *Deleted

## 2021-02-21 DIAGNOSIS — R931 Abnormal findings on diagnostic imaging of heart and coronary circulation: Secondary | ICD-10-CM

## 2021-02-21 NOTE — Telephone Encounter (Signed)
pt aware of results °Echo scheduled °

## 2021-02-21 NOTE — Telephone Encounter (Signed)
-----   Message from [name], MD sent at 02/20/2021  7:17 AM EST ----- No CAD; schedule echo to assess LV function Tom Miranda

## 2021-02-26 ENCOUNTER — Telehealth: Payer: Self-pay | Admitting: Psychiatry

## 2021-02-26 NOTE — Telephone Encounter (Signed)
Pt called states he had a bad reaction to the dye and got sick for his MRI W/contrast. Pt states he had the regular MRI done a week ago. Pt requesting a call back.

## 2021-02-26 NOTE — Telephone Encounter (Signed)
Contacted pt, informed him he has a MRI scheduled W/O contrast 03/01/21 at 920. This will not include the dye.  Pt was appreciative for the call back.

## 2021-03-01 ENCOUNTER — Ambulatory Visit: Payer: Medicaid Other | Attending: Psychiatry | Admitting: Physical Therapy

## 2021-03-01 ENCOUNTER — Other Ambulatory Visit: Payer: Self-pay

## 2021-03-01 ENCOUNTER — Telehealth: Payer: Self-pay | Admitting: Physical Therapy

## 2021-03-01 ENCOUNTER — Ambulatory Visit
Admission: RE | Admit: 2021-03-01 | Discharge: 2021-03-01 | Disposition: A | Discharge status: Home / Self Care | Payer: Medicaid Other | Source: Ambulatory Visit | Attending: Psychiatry | Admitting: Psychiatry

## 2021-03-01 ENCOUNTER — Other Ambulatory Visit: Payer: Self-pay | Admitting: Psychiatry

## 2021-03-01 ENCOUNTER — Encounter: Payer: Self-pay | Admitting: Physical Therapy

## 2021-03-01 VITALS — BP 112/78 | HR 90

## 2021-03-01 DIAGNOSIS — R200 Anesthesia of skin: Secondary | ICD-10-CM

## 2021-03-01 DIAGNOSIS — F0781 Postconcussional syndrome: Secondary | ICD-10-CM | POA: Insufficient documentation

## 2021-03-01 DIAGNOSIS — R413 Other amnesia: Secondary | ICD-10-CM | POA: Diagnosis not present

## 2021-03-01 DIAGNOSIS — G44321 Chronic post-traumatic headache, intractable: Secondary | ICD-10-CM | POA: Diagnosis not present

## 2021-03-01 DIAGNOSIS — R5381 Other malaise: Secondary | ICD-10-CM | POA: Diagnosis not present

## 2021-03-01 DIAGNOSIS — M542 Cervicalgia: Secondary | ICD-10-CM | POA: Diagnosis not present

## 2021-03-01 DIAGNOSIS — R4789 Other speech disturbances: Secondary | ICD-10-CM

## 2021-03-01 DIAGNOSIS — R531 Weakness: Secondary | ICD-10-CM | POA: Diagnosis not present

## 2021-03-01 NOTE — Therapy (Signed)
[facility] [address] [place], [place], [address] Phone: [phone]   Fax:  [phone]  Physical Therapy Evaluation  Patient Details  Name: Tom Miranda MRN: [id number] Date of Birth: Jun 25, 1975 Referring Provider (PT): Ocie Doyne, MD   Encounter Date: 03/01/2021   PT End of Session - 03/01/21 2049     Visit Number 1    Number of Visits 9    Date for PT Re-Evaluation 04/30/21    Authorization Type Medicaid Healthy [employer]    Authorization - Visit Number 0    Authorization - Number of Visits 8    PT Start Time 1315    PT Stop Time 1400    PT Time Calculation (min) 45 min    Activity Tolerance Patient limited by pain    Behavior During Therapy [facility] for tasks assessed/performed             Past Medical History:  Diagnosis Date   Asthma    GERD (gastroesophageal reflux disease)    Hyperlipidemia     Past Surgical History:  Procedure Laterality Date   No prior surgery      Vitals:   03/01/21 1337  BP: 112/78  Pulse: 90  SpO2: 94%      Subjective Assessment - 03/01/21 1324     Subjective Pt experienced a fall in June at the grocery store; experienced concussion, head and neck pain.  Also having difficulty with concentration and attention and word finding difficulty.  Pt also very fatigued; sleep has been disturbed.  Pt also experiencing light sensitivity and feels like his hearing has been affected: has to turn up music to hear it but then has to turn it down due to sensitivity to noise.  Is experiencing chest pain currently; had EKG without any abnormal findings.  Feels like this chest pain is different than his normal acid reflux; took medication for reflux this morning.  Pt reporting improvement in chest pain with belching.    Pertinent History asthma, GERD, HLD, glaucoma, prediabetes    Limitations Other (comment)   sleeping; work   Diagnostic tests MRI today; Ophthalmology on Dec 3     Currently in Pain? Yes                [facility] PT Assessment - 03/01/21 1341       Assessment   Medical Diagnosis Post-Concussion, neck pain, HA    Referring Provider (PT) Ocie Doyne, MD    Onset Date/Surgical Date 02/12/21      Precautions   Precautions Other (comment)    Precaution Comments asthma, GERD, HLD, glaucoma, prediabetes      Balance Screen   Has the patient fallen in the past 6 months Yes    How many times? 1      Home Environment   Living Environment Private residence    Living Arrangements Other relatives    Type of Home House    Additional Comments pt is driving      Prior Function   Level of Independence Independent    Vocation Full time employment    Vocation Requirements self-employed; Clothing store online - placing orders, fulfilling orders, shipping.  Using computer and phone all the time    Leisure skating with kids, movies      Sensation   Light Touch Impaired by gross assessment    Additional Comments tingling L shoulder, hand, foot and L chest.  Off and on.  Chest pain increases tingling.  Movement of the arm, hand and LE helps the tingling improve.  Intact to light touch bilat      Coordination   Gross Motor Movements are Fluid and Coordinated Yes    Finger Nose Finger Test [facility]; more concentration on LUE    Heel Shin Test WFL      ROM / Strength   AROM / PROM / Strength Strength;AROM      AROM   Overall AROM  Deficits    Overall AROM Comments Reports main HA is in R temple; reports 3/10 at baseline.  Increases at night.  Pain in neck is posterior and then down each shoulder along upper trap distribution    AROM Assessment Site Cervical    Cervical Flexion 15    Cervical Extension 20    Cervical - Right Side Bend 20   increase in HA   Cervical - Left Side Bend 10   increase in HA   Cervical - Right Rotation 20    Cervical - Left Rotation 30      Strength   Overall Strength Within functional limits for tasks performed       Transfers   Transfers Sit to Stand;Stand to Sit    Sit to Stand 7: Independent      Ambulation/Gait   Ambulation/Gait Yes    Ambulation/Gait Assistance 7: Independent    Assistive device None    Gait Pattern Within Functional Limits    Ambulation Surface Level;Indoor                        Objective measurements completed on examination: See above findings.                PT Education - 03/01/21 2049     Education Details clinical findings, PT POC and goals    Person(s) Educated Patient    Methods Explanation    Comprehension Verbalized understanding                 PT Long Term Goals - 03/01/21 2104       PT LONG TERM GOAL #1   Title Pt will demonstrate independence with final neck and visual/vestibular HEP    Baseline No HEP to date    Time 8    Period Weeks    Status New    Target Date 04/30/21      PT LONG TERM GOAL #2   Title Pt will demonstrate demonstrate 15-20 degree improvement in cervical flexion/extension; 10 deg improvement in lateral flexion and increase in rotation to 60 deg bilaterally to improve safety with driving    Baseline 93/26 deg flexion/extension; 20/10 R/L sidebending; 20/30 R/L rotation    Time 8    Period Weeks    Status New    Target Date 04/30/21      PT LONG TERM GOAL #3   Title Pt will report a decrease in nightly HA pain to 2/10 overall to improve sleep    Baseline 8/10 at night; disturbs sleep    Time 8    Period Weeks    Status New    Target Date 04/30/21      PT LONG TERM GOAL #4   Title Pt will report ability to perform work on computer and phone for 4-6 hours without increase in eye fatigue/headache    Baseline looking at computer and phone increase patient's HA due to light sensitivity    Time 8    Period  Weeks    Status New    Target Date 04/30/21                    Plan - 03/01/21 2052     Clinical Impression Statement Pt is a 45 year old male referred to Neuro OPPT for  evaluation of post-concussive syndrome. Pt's PMH is significant for the following: asthma, GERD, HLD, glaucoma, prediabetes. The following deficits were noted during pt's exam: intermittent tingling in LUE and LLE but has intact sensation to light touch, neck pain with impaired cervical spine AROM, post-traumatic HA, and impaired vision.  Pt is self-employed and spends most of his day on the computer or phone and would benefit from skilled PT to address these impairments and functional limitations to maximize functional mobility independence and to return to performing activities with his children.    Personal Factors and Comorbidities Comorbidity 2;Profession    Comorbidities asthma, GERD, HLD, glaucoma, prediabetes    Examination-Activity Limitations Lift;Sleep;Reach Overhead    Examination-Participation Restrictions Community Activity;Occupation    Stability/Clinical Decision Making Stable/Uncomplicated    Clinical Decision Making Low    Rehab Potential Good    PT Frequency 1x / week    PT Duration 8 weeks    PT Treatment/Interventions ADLs/Self Care Home Management;Cryotherapy;Moist Heat;Functional mobility training;Therapeutic activities;Therapeutic exercise;Balance training;Neuromuscular re-education;Manual techniques;Dry needling;Patient/family education;Passive range of motion;Taping;Visual/perceptual remediation/compensation;Vestibular    PT Next Visit Plan Did he get results of MRI?  Did order for Speech therapy come in?  If so, please schedule for SLP eval.  Did he see ophthalmologist? Initiate gentle neck manual therapy to address pain/HA.  HEP focusing on neck ROM.  Visual/vestibular evaluation.  Dry needling upper trap, neck paraspinals, temporalis?    Recommended Other Services Speech therapy    Consulted and Agree with Plan of Care Patient             Patient will benefit from skilled therapeutic intervention in order to improve the following deficits and impairments:  Decreased  activity tolerance, Decreased range of motion, Impaired vision/preception, Pain  Visit Diagnosis: Cervicalgia  Intractable chronic post-traumatic headache     Problem List Patient Active Problem List   Diagnosis Date Noted   Concussion with no loss of consciousness 10/20/2020   Post-concussion headache 10/20/2020   Prediabetes 02/16/2020   Hyperlipidemia 02/16/2020   GERD (gastroesophageal reflux disease)    Obesity (BMI 30.0-34.9) 06/02/2019   Glaucoma 06/02/2019    Dierdre Highman, PT, DPT 03/01/21    9:18 PM     Outpt Rehabilitation [facility] [address] [place], [place], [address] Phone: [phone]   Fax:  [phone]  Name: Tom Miranda MRN: [id number] Date of Birth: 20-May-1975

## 2021-03-01 NOTE — Telephone Encounter (Signed)
Hello Dr. Delena Bali, Tom Miranda was evaluated for Physical Therapy today to address headache and neck pain. He reported to me ongoing issues with attention/concentration and word finding issues and feels he would really benefit from Speech Therapy.  I saw in your note that you also recommended Speech therapy to address cognitive impairments but I wasn't able to find an order for it in Epic.    In order for [place] to set [name] up with Speech therapy would you please enter an order into Epic for Speech Therapy Eval and Treat?  Thank you! Dierdre Highman, PT, DPT 03/01/21    5:13 PM

## 2021-03-02 NOTE — Telephone Encounter (Signed)
I've placed a referral for speech therapy. Please let me know if you have any trouble finding it now, thanks!

## 2021-03-07 ENCOUNTER — Other Ambulatory Visit: Payer: Self-pay

## 2021-03-07 ENCOUNTER — Ambulatory Visit (HOSPITAL_COMMUNITY): Payer: Medicaid Other | Attending: Cardiology

## 2021-03-07 DIAGNOSIS — R931 Abnormal findings on diagnostic imaging of heart and coronary circulation: Secondary | ICD-10-CM | POA: Insufficient documentation

## 2021-03-07 LAB — ECHOCARDIOGRAM COMPLETE
Area-P 1/2: 4.89 cm2
S' Lateral: 3.6 cm

## 2021-03-15 ENCOUNTER — Encounter: Payer: Self-pay | Admitting: *Deleted

## 2021-03-16 ENCOUNTER — Ambulatory Visit: Payer: Medicaid Other | Attending: Psychiatry

## 2021-03-16 DIAGNOSIS — R41841 Cognitive communication deficit: Secondary | ICD-10-CM | POA: Insufficient documentation

## 2021-03-16 DIAGNOSIS — M542 Cervicalgia: Secondary | ICD-10-CM | POA: Insufficient documentation

## 2021-03-16 DIAGNOSIS — R4701 Aphasia: Secondary | ICD-10-CM | POA: Insufficient documentation

## 2021-03-16 DIAGNOSIS — G44321 Chronic post-traumatic headache, intractable: Secondary | ICD-10-CM | POA: Insufficient documentation

## 2021-03-21 ENCOUNTER — Ambulatory Visit: Payer: Medicaid Other | Admitting: Rehabilitation

## 2021-03-30 ENCOUNTER — Ambulatory Visit: Payer: Medicaid Other

## 2021-03-30 ENCOUNTER — Ambulatory Visit: Payer: Medicaid Other | Admitting: Physical Therapy

## 2021-03-30 ENCOUNTER — Other Ambulatory Visit: Payer: Self-pay

## 2021-03-30 DIAGNOSIS — M542 Cervicalgia: Secondary | ICD-10-CM | POA: Diagnosis present

## 2021-03-30 DIAGNOSIS — R4701 Aphasia: Secondary | ICD-10-CM | POA: Diagnosis present

## 2021-03-30 DIAGNOSIS — G44321 Chronic post-traumatic headache, intractable: Secondary | ICD-10-CM

## 2021-03-30 DIAGNOSIS — R41841 Cognitive communication deficit: Secondary | ICD-10-CM | POA: Diagnosis not present

## 2021-03-30 NOTE — Therapy (Signed)
[facility] [address] [place], [place], [address] Phone: [phone]   Fax:  [phone]  Physical Therapy Treatment  Patient Details  Name: Tom Miranda MRN: [id number] Date of Birth: 19-Jul-1975 Referring Provider (PT): Ocie Doyne, MD   Encounter Date: 03/30/2021   PT End of Session - 03/30/21 1211     Visit Number 2    Number of Visits 9    Date for PT Re-Evaluation 04/30/21    Authorization Type Medicaid Healthy Blue    Authorization - Visit Number 1    Authorization - Number of Visits 8    PT Start Time 0935    PT Stop Time 1015    PT Time Calculation (min) 40 min    Activity Tolerance Patient tolerated treatment well    Behavior During Therapy WFL for tasks assessed/performed             Past Medical History:  Diagnosis Date   Asthma    GERD (gastroesophageal reflux disease)    Hyperlipidemia     Past Surgical History:  Procedure Laterality Date   No prior surgery      There were no vitals filed for this visit.   Subjective Assessment - 03/30/21 0942     Subjective Had cardiology appointment but hasn't heard the results of the testing yet.  Wasn't able to come to speech evaluation but is still interested in being evaluated by speech therapy.  Reports he is still experiencing all the same symptoms.  Missed speech and eye doctor due to flight delayed.  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    Pertinent History asthma, GERD, HLD, glaucoma, prediabetes    Limitations Other (comment)   sleeping; work   Diagnostic tests MRI today; Ophthalmology moved to January the 4th               Vestibular Assessment - 03/30/21 0947       Vestibular Assessment   General Observation Pt reports feeling tired and not sleeping.      Symptom Behavior   Subjective history of current problem No dizziness today.  Having some sensitivity to the lights today - wearing hat.  Getting dizziness a couple times a  week. Occurs when he gets up and starts moving around - feels lightheaded/woozy.  Happened in the shower earlier this week.  Felt nausea yesterday but did vomit.    Type of Dizziness  Lightheadedness    Frequency of Dizziness weekly    Duration of Dizziness seconds    Symptom Nature Positional;Motion provoked    Aggravating Factors Sit to stand;Activity in general    Relieving Factors Lying supine    Progression of Symptoms No change since onset      Oculomotor Exam   Oculomotor Alignment Normal    Ocular ROM WFL    Spontaneous Absent    Gaze-induced  Absent    Smooth Pursuits Intact    Saccades Slow    Comment Exophoria with cover, cross-cover test      Oculomotor Exam-Fixation Suppressed    Left Head Impulse Not tested due to guarding    Right Head Impulse not tested due to guarding      Vestibulo-Ocular Reflex   VOR to Slow Head Movement Normal    VOR Cancellation Normal      Positional Sensitivities   Sit to Supine No dizziness    Supine to Left Side No dizziness    Supine to Right Side Lightheadedness  Supine to Sitting No dizziness    Right Hallpike No dizziness    Up from Right Hallpike No dizziness    Up from Left Hallpike No dizziness    Nose to Right Knee No dizziness    Right Knee to Sitting No dizziness    Nose to Left Knee No dizziness    Left Knee to Sitting No dizziness    Head Turning x 5 No dizziness   soreness in neck   Head Nodding x 5 No dizziness   soreness in neck   Pivot Right in Standing No dizziness    Pivot Left in Standing No dizziness    Rolling Right No dizziness    Rolling Left No dizziness              PT Education - 03/30/21 1211     Education Details vestibular findings, plan to initiate HEP next session, rescheduled SLP evaluation    Person(s) Educated Patient    Methods Explanation    Comprehension Verbalized understanding               PT Long Term Goals - 03/01/21 2104       PT LONG TERM GOAL #1   Title Pt  will demonstrate independence with final neck and visual/vestibular HEP    Baseline No HEP to date    Time 8    Period Weeks    Status New    Target Date 04/30/21      PT LONG TERM GOAL #2   Title Pt will demonstrate demonstrate 15-20 degree improvement in cervical flexion/extension; 10 deg improvement in lateral flexion and increase in rotation to 60 deg bilaterally to improve safety with driving    Baseline 54/00 deg flexion/extension; 20/10 R/L sidebending; 20/30 R/L rotation    Time 8    Period Weeks    Status New    Target Date 04/30/21      PT LONG TERM GOAL #3   Title Pt will report a decrease in nightly HA pain to 2/10 overall to improve sleep    Baseline 8/10 at night; disturbs sleep    Time 8    Period Weeks    Status New    Target Date 04/30/21      PT LONG TERM GOAL #4   Title Pt will report ability to perform work on computer and phone for 4-6 hours without increase in eye fatigue/headache    Baseline looking at computer and phone increase patient's HA due to light sensitivity    Time 8    Period Weeks    Status New    Target Date 04/30/21                   Plan - 03/30/21 1212     Clinical Impression Statement Treatment session focused on more in depth assessment of oculomotor and vestibular system.  Pt did not exhibit any signs or symptoms of peripheral vertigo or impaired VOR.  Pt does present with mild visual motion sensitivity and ongoing neck pain and limited ROM.  Will initiate HEP next session based on findings from eval and vestibular assessment.    Personal Factors and Comorbidities Comorbidity 2;Profession    Comorbidities asthma, GERD, HLD, glaucoma, prediabetes    Examination-Activity Limitations Lift;Sleep;Reach Overhead    Examination-Participation Restrictions Community Activity;Occupation    Stability/Clinical Decision Making Stable/Uncomplicated    Rehab Potential Good    PT Frequency 1x / week    PT Duration 8  weeks    PT  Treatment/Interventions ADLs/Self Care Home Management;Cryotherapy;Moist Heat;Functional mobility training;Therapeutic activities;Therapeutic exercise;Balance training;Neuromuscular re-education;Manual techniques;Dry needling;Patient/family education;Passive range of motion;Taping;Visual/perceptual remediation/compensation;Vestibular    PT Next Visit Plan Check orthostatics.  Initiate HEP focusing on neck ROM.    Consulted and Agree with Plan of Care Patient             Patient will benefit from skilled therapeutic intervention in order to improve the following deficits and impairments:  Decreased activity tolerance, Decreased range of motion, Impaired vision/preception, Pain  Visit Diagnosis: Cervicalgia  Intractable chronic post-traumatic headache     Problem List Patient Active Problem List   Diagnosis Date Noted   Concussion with no loss of consciousness 10/20/2020   Post-concussion headache 10/20/2020   Prediabetes 02/16/2020   Hyperlipidemia 02/16/2020   GERD (gastroesophageal reflux disease)    Obesity (BMI 30.0-34.9) 06/02/2019   Glaucoma 06/02/2019   Dierdre Highman, PT, DPT 03/30/21    12:24 PM   [place] [facility] [address] [place], [place], [address] Phone: [phone]   Fax:  [phone]  Name: Tom Miranda MRN: [id number] Date of Birth: 1976-01-29

## 2021-04-05 ENCOUNTER — Ambulatory Visit: Payer: Medicaid Other | Admitting: Physical Therapy

## 2021-04-05 ENCOUNTER — Ambulatory Visit: Payer: Medicaid Other

## 2021-04-05 ENCOUNTER — Other Ambulatory Visit: Payer: Self-pay

## 2021-04-05 DIAGNOSIS — M542 Cervicalgia: Secondary | ICD-10-CM

## 2021-04-05 DIAGNOSIS — R41841 Cognitive communication deficit: Secondary | ICD-10-CM | POA: Diagnosis not present

## 2021-04-05 DIAGNOSIS — G44321 Chronic post-traumatic headache, intractable: Secondary | ICD-10-CM

## 2021-04-05 NOTE — Therapy (Signed)
[facility] [address] [place], [place], [address] Phone: [phone]   Fax:  [phone]  Physical Therapy Treatment  Patient Details  Name: Tom Miranda MRN: [id number] Date of Birth: 1976-01-04 Referring Provider (PT): Ocie Doyne, MD   Encounter Date: 04/05/2021   PT End of Session - 04/05/21 1132     Visit Number 3    Number of Visits 9    Date for PT Re-Evaluation 04/30/21    Authorization Type Medicaid Healthy Blue    Authorization - Visit Number 2    Authorization - Number of Visits 8    PT Start Time 0940    PT Stop Time 1017    PT Time Calculation (min) 37 min    Activity Tolerance Patient tolerated treatment well    Behavior During Therapy WFL for tasks assessed/performed             Past Medical History:  Diagnosis Date   Asthma    GERD (gastroesophageal reflux disease)    Hyperlipidemia     Past Surgical History:  Procedure Laterality Date   No prior surgery      There were no vitals filed for this visit.   Subjective Assessment - 04/05/21 0942     Subjective Forgot Speech was before PT, thought it was after - rescheduled SLP evaluation to tomorrow.  Feeling much better.  Chest pain has been much better.  Neck is still sore on the L side.    Pertinent History asthma, GERD, HLD, glaucoma, prediabetes    Limitations Other (comment)   sleeping; work   Diagnostic tests MRI today; Ophthalmology moved to January the 4th    Currently in Pain? Yes    Pain Score 4     Pain Location Neck    Pain Orientation Left            Initiated patient's HEP.  Guided pt through all exercises below providing cues for technique and cues for monitoring symptoms.  Pt tolerated well and only reported eye fatigue.    Gaze Stabilization: Sitting    Keeping eyes on target on wall 3 feet away, tilt head down 15-30 and move head side to side for __30__ seconds. Repeat while moving head up and down  for __30__ seconds. Do __2__ sessions per day.   Access Code: [facility] URL: [email or web address] Date: 04/05/2021 Prepared by: Bufford Lope  Exercises Seated Assisted Cervical Rotation with Towel - 1 x daily - 7 x weekly - 5 reps - 5-6 second hold Seated Cervical Sidebending Stretch - 1 x daily - 7 x weekly - 2 sets - 30 second hold Seated Shoulder Rolls - 1 x daily - 7 x weekly - 2 sets - 10 reps Seated Horizontal Smooth Pursuit - 1 x daily - 7 x weekly - 1 sets - 7 reps Seated Vertical Smooth Pursuit - 1 x daily - 7 x weekly - 1 sets - 7 reps Seated Proximal-Distal Smooth Pursuit - 1 x daily - 7 x weekly - 1 sets - 7 reps      PT Education - 04/05/21 1132     Education Details speech therapy evaluation tomorrow, initiated HEP    Person(s) Educated Patient    Methods Explanation;Demonstration;Handout    Comprehension Verbalized understanding;Returned demonstration                 PT Long Term Goals - 03/01/21 2104       PT LONG TERM GOAL #1  Title Pt will demonstrate independence with final neck and visual/vestibular HEP    Baseline No HEP to date    Time 8    Period Weeks    Status New    Target Date 04/30/21      PT LONG TERM GOAL #2   Title Pt will demonstrate demonstrate 15-20 degree improvement in cervical flexion/extension; 10 deg improvement in lateral flexion and increase in rotation to 60 deg bilaterally to improve safety with driving    Baseline 11/94 deg flexion/extension; 20/10 R/L sidebending; 20/30 R/L rotation    Time 8    Period Weeks    Status New    Target Date 04/30/21      PT LONG TERM GOAL #3   Title Pt will report a decrease in nightly HA pain to 2/10 overall to improve sleep    Baseline 8/10 at night; disturbs sleep    Time 8    Period Weeks    Status New    Target Date 04/30/21      PT LONG TERM GOAL #4   Title Pt will report ability to perform work on computer and phone for 4-6 hours without increase in eye  fatigue/headache    Baseline looking at computer and phone increase patient's HA due to light sensitivity    Time 8    Period Weeks    Status New    Target Date 04/30/21                   Plan - 04/05/21 1133     Clinical Impression Statement Pt demonstrated decreased sensitivity to light today and demonstrated improved tolerance to eye and neck movements.  Initiated HEP focusing on habituation to oculomotor, slow VOR, and neck ROM exercises.  Will continue to address and progress towards LTG as pt is able to tolerate.    Personal Factors and Comorbidities Comorbidity 2;Profession    Comorbidities asthma, GERD, HLD, glaucoma, prediabetes    Examination-Activity Limitations Lift;Sleep;Reach Overhead    Examination-Participation Restrictions Community Activity;Occupation    Stability/Clinical Decision Making Stable/Uncomplicated    Rehab Potential Good    PT Frequency 1x / week    PT Duration 8 weeks    PT Treatment/Interventions ADLs/Self Care Home Management;Cryotherapy;Moist Heat;Functional mobility training;Therapeutic activities;Therapeutic exercise;Balance training;Neuromuscular re-education;Manual techniques;Dry needling;Patient/family education;Passive range of motion;Taping;Visual/perceptual remediation/compensation;Vestibular    PT Next Visit Plan How is HEP?  Progress HEP; add standing balance?    Consulted and Agree with Plan of Care Patient             Patient will benefit from skilled therapeutic intervention in order to improve the following deficits and impairments:  Decreased activity tolerance, Decreased range of motion, Impaired vision/preception, Pain  Visit Diagnosis: Cervicalgia  Intractable chronic post-traumatic headache     Problem List Patient Active Problem List   Diagnosis Date Noted   Concussion with no loss of consciousness 10/20/2020   Post-concussion headache 10/20/2020   Prediabetes 02/16/2020   Hyperlipidemia 02/16/2020   GERD  (gastroesophageal reflux disease)    Obesity (BMI 30.0-34.9) 06/02/2019   Glaucoma 06/02/2019    Dierdre Highman, PT, DPT 04/05/21    11:38 AM   [place] [facility] [address] [place], [place], [address] Phone: [phone]   Fax:  [phone]  Name: JAMEIR AKE MRN: [id number] Date of Birth: 1975-08-11

## 2021-04-05 NOTE — Patient Instructions (Signed)
Gaze Stabilization: Sitting    Keeping eyes on target on wall 3 feet away, tilt head down 15-30 and move head side to side for __30__ seconds. Repeat while moving head up and down for __30__ seconds. Do __2__ sessions per day.   Access Code: [facility] URL: [email or web address] Date: 04/05/2021 Prepared by: Bufford Lope  Exercises Seated Assisted Cervical Rotation with Towel - 1 x daily - 7 x weekly - 5 reps - 5-6 second hold Seated Cervical Sidebending Stretch - 1 x daily - 7 x weekly - 2 sets - 30 second hold Seated Shoulder Rolls - 1 x daily - 7 x weekly - 2 sets - 10 reps Seated Horizontal Smooth Pursuit - 1 x daily - 7 x weekly - 1 sets - 7 reps Seated Vertical Smooth Pursuit - 1 x daily - 7 x weekly - 1 sets - 7 reps Seated Proximal-Distal Smooth Pursuit - 1 x daily - 7 x weekly - 1 sets - 7 reps

## 2021-04-06 ENCOUNTER — Ambulatory Visit: Payer: Medicaid Other

## 2021-04-06 DIAGNOSIS — R41841 Cognitive communication deficit: Secondary | ICD-10-CM

## 2021-04-06 DIAGNOSIS — R4701 Aphasia: Secondary | ICD-10-CM
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
## 2021-04-18 DIAGNOSIS — H5213 Myopia, bilateral: Secondary | ICD-10-CM | POA: Diagnosis not present

## 2021-04-19 ENCOUNTER — Ambulatory Visit: Payer: Medicaid Other | Attending: Psychiatry | Admitting: Physical Therapy

## 2021-04-19 ENCOUNTER — Other Ambulatory Visit: Payer: Self-pay

## 2021-04-19 ENCOUNTER — Encounter: Payer: Self-pay | Admitting: Speech Pathology

## 2021-04-19 ENCOUNTER — Ambulatory Visit: Payer: Medicaid Other | Admitting: Speech Pathology

## 2021-04-19 DIAGNOSIS — R4701 Aphasia: Secondary | ICD-10-CM

## 2021-04-19 DIAGNOSIS — R41841 Cognitive communication deficit: Secondary | ICD-10-CM

## 2021-04-19 DIAGNOSIS — G44321 Chronic post-traumatic headache, intractable: Secondary | ICD-10-CM | POA: Diagnosis present

## 2021-04-19 DIAGNOSIS — M542 Cervicalgia: Secondary | ICD-10-CM | POA: Diagnosis present

## 2021-04-19 NOTE — Patient Instructions (Addendum)
Gaze Stabilization: Sitting    Keeping eyes on target on wall 3 feet away, tilt head down 15-30 and move head side to side for __30__ seconds. Repeat while moving head up and down for __30__ seconds.  Try to keep your neck and shoulder muscles as relaxed as you can.  Think Smooth movement!  Don't feel like you have to push it. Do __2__ sessions per day.   Access Code: [facility] URL: [email or web address] Date: 04/19/2021 Prepared by: Bufford Lope  Exercises Seated Assisted Cervical Rotation with Towel - 1 x daily - 7 x weekly - 5 reps - 5-6 second hold Seated Cervical Sidebending Stretch - 1 x daily - 7 x weekly - 2 sets - 30 second hold Seated Shoulder Rolls - 1 x daily - 7 x weekly - 2 sets - 10 reps STANDING Proximal-Distal Smooth Pursuit - 1 x daily - 7 x weekly - 1 sets - 7 reps STANDING FEET WIDE - EYES MOVE SIDE TO SIDE; UP AND DOWN - 1 x daily - 7 x weekly - 1 sets - 7 reps

## 2021-04-19 NOTE — Patient Instructions (Addendum)
°  Bring in your notebook that you are using   Have a box or basket labeled "keys, wallet" and put them in the same place  When you are talking, try to have a quiet back ground without distractions or interruptions  Take breaks in between tasks, presentations, family time in a quiet dark room  Sensory overstimulation is happening - pay attention to how you feel in noisy, bright places such as a grocery, restaurants - take break in a quiet dark room  Brain injury is invisible - people will forget in the moment and you may need to remind others that you are healing and can't help them or can't talk for longer than 15 to 20 minutes  Since you have a brain injury, you have limited pennies to spend - you need to prioritize where you are going to spend pennies  Your job and your younger kids are your priority - set boundaries with others

## 2021-04-19 NOTE — Therapy (Signed)
[facility] [address] [place], [place], [address] Phone: [phone]   Fax:  [phone]  Speech Language Pathology Treatment  Patient Details  Name: Tom Miranda MRN: [id number] Date of Birth: Feb 09, 1976 Referring Provider (SLP): Ocie Doyne, MD   Encounter Date: 04/19/2021   End of Session - 04/19/21 1313     Visit Number 2    Number of Visits 17    Date for SLP Re-Evaluation 06/01/21    Authorization Type Medicaid Healthy Blue    Authorization Time Period requested 15 visits on 04-06-21    SLP Start Time 1100    SLP Stop Time  1145    SLP Time Calculation (min) 45 min    Activity Tolerance Patient tolerated treatment well             Past Medical History:  Diagnosis Date   Asthma    GERD (gastroesophageal reflux disease)    Hyperlipidemia     Past Surgical History:  Procedure Laterality Date   No prior surgery      There were no vitals filed for this visit.   Subjective Assessment - 04/19/21 1111     Subjective "struggle"    Currently in Pain? Yes    Pain Score 4     Pain Location Neck    Pain Orientation Left    Pain Descriptors / Indicators Aching                   ADULT SLP TREATMENT - 04/19/21 1113       General Information   Behavior/Cognition Alert;Cooperative;Pleasant mood      Treatment Provided   Treatment provided Cognitive-Linquistic      Cognitive-Linquistic Treatment   Treatment focused on Cognition;Patient/family/caregiver education    Skilled Treatment Completed CLQT:  Attention - moderate impairment, memory - moderate impairment, executive function - low WNL, Language - low WNL, visuospatial skills - mild impiarment.   Yi reports losing his wallet, keys and the remote frequently. Generated strategy of labeling a box or basket and keeping his keys and wallet in the same spot. He is misplacing jewelery and thinking it was stolen. He continues to be  unable to sleep due to headaches. Educated re: some causes of headache including sensory overstimulation, focusing for long times without breaks, pushign through fatigue. Chaysen does not feel he can take 5 minute breaks in a dark quiet space, so we generated strategy of working in a quiet space when he has to work or foucs. He Id'd his car as a quiet place to do work emails and calls. He is using a notebook to write down important tasks/messages, but is not completely successful. Requested he bring in this notebook. Educated him re: energy conservation and need to prioritze his responsibilities as he continues to be responsible for adult children, grandchildren, his sisters, mother and states friends call on him freqeuntly for help. Reducing daily responsibilites and tasks may reduce headaches and improve sleep. Will consider a headache log. Significant word finding difficulty throughout session      Assessment / Recommendations / Plan   Plan Continue with current plan of care      Progression Toward Goals   Progression toward goals Progressing toward goals              SLP Education - 04/19/21 1307     Education Details keep keys and wallet in the same place, work in quiet place, take breaks as able, set  daily priorities and boundaries    Person(s) Educated Patient    Methods Explanation    Comprehension Verbalized understanding              SLP Short Term Goals - 04/19/21 1312       SLP SHORT TERM GOAL #1   Title Pt will complete CLQT and cognitive communication PROM in first ST session    Baseline CLQT and PROM to be completed    Time 4    Period Weeks    Status On-going      SLP SHORT TERM GOAL #2   Title Pt will implement attention and memory compensations to aid daily functioning given occasional min A over 2 sessions    Baseline Not effectively compensating for deficits    Time 4    Period Weeks    Status On-going      SLP SHORT TERM GOAL #3   Title Pt will  implement memory compensations to aid appointment management to attend 3/3 therapy sessions on time    Baseline Pt missed 2 ST evals    Time 4    Period Weeks    Status On-going      SLP SHORT TERM GOAL #4   Title Pt will use anomia compensations to aid word finding in 5 minute conversations given occasional min A over 2 sessions    Baseline word finding diffculty    Time 4    Period Weeks    Status On-going      SLP SHORT TERM GOAL #5   Title Pt will utilize comprehension techniques to aid auditory comprehension during 5-10 minute video/presentation given occasional min A over 2 sessions    Baseline difficulty comprehending conversations    Time 4    Period Weeks    Status On-going              SLP Long Term Goals - 04/19/21 1312       SLP LONG TERM GOAL #1   Title Pt will implement attention and memory compensations to aid daily functioning given rare min A over 2 sessions    Baseline Not compensating for deficits    Time 8    Period Weeks    Status On-going      SLP LONG TERM GOAL #2   Title Pt will effectively manage appointments with no missed appointments reported over 2 weeks    Baseline Missed appointments    Time 8    Period Weeks    Status On-going      SLP LONG TERM GOAL #3   Title Pt will present work related presentation with use of anomia compensations given rare min A over 2 sessions    Baseline difficulty speaking during work presentations    Time 8    Period Weeks    Status On-going      SLP LONG TERM GOAL #4   Title Pt will write 2 short stories with cohesive storyline and rare spelling errors given rare min A    Baseline difficulty writing reported    Time 8    Period Weeks    Status On-going              Plan - 04/19/21 1309     Clinical Impression Statement Moderate cognitive impairments persist in memory, attention and word finding. Initiated training in compensations for attention,, memory, keeping track of household items and  reducing environmental stimulation when he is working. Sensory overstimulation is endorsed as  well as severe head aches at night. Ongoing skilled ST warranted to maximize cognition and communication to return to PLOF and QOL    Speech Therapy Frequency 2x / week    Duration 8 weeks    Treatment/Interventions SLP instruction and feedback;Internal/external aids;Compensatory techniques;Language facilitation;Cognitive reorganization;Multimodal communcation approach;Functional tasks;Compensatory strategies;Patient/family education;Cueing hierarchy    Potential to Achieve Goals Good             Patient will benefit from skilled therapeutic intervention in order to improve the following deficits and impairments:   Cognitive communication deficit  Aphasia    Problem List Patient Active Problem List   Diagnosis Date Noted   Concussion with no loss of consciousness 10/20/2020   Post-concussion headache 10/20/2020   Prediabetes 02/16/2020   Hyperlipidemia 02/16/2020   GERD (gastroesophageal reflux disease)    Obesity (BMI 30.0-34.9) 06/02/2019   Glaucoma 06/02/2019    Dakin Madani, Radene JourneyLaura Ann, CCC-SLP 04/19/2021, 1:16 PM  [facility] [address] [place], [place], [address] Phone: [phone]   Fax:  [phone]   Name: Astrid Draftsddie L Coglianese MRN: [id number] Date of Birth: 1975-07-04

## 2021-04-19 NOTE — Therapy (Signed)
[facility] [address] [place], [place], [address] Phone: [phone]   Fax:  [phone]  Physical Therapy Treatment  Patient Details  Name: Tom Miranda L Biondo MRN: [id number] Date of Birth: [birth year] Referring Provider (PT): Ocie Doynehima, Jennifer, MD   Encounter Date: 04/19/2021   PT End of Session - 04/19/21 1122     Visit Number 4    Number of Visits 9    Date for PT Re-Evaluation 04/30/21    Authorization Type Medicaid Healthy [employer]    Authorization - Visit Number 3    Authorization - Number of Visits 8    PT Start Time 1022    PT Stop Time 1102    PT Time Calculation (min) 40 min    Activity Tolerance Patient tolerated treatment well    Behavior During Therapy WFL for tasks assessed/performed             Past Medical History:  Diagnosis Date   Asthma    GERD (gastroesophageal reflux disease)    Hyperlipidemia     Past Surgical History:  Procedure Laterality Date   No prior surgery      There were no vitals filed for this visit.   Subjective Assessment - 04/19/21 1023     Subjective Speech went well.  [place] [facility]  Neck is getting better, not as bad, but still tight.  No HA or dizziness today.  Still having difficulty sleeping.  Had eye appointment, gave him some drops to put in his eyes to help with pressure, will be getting new glasses in the next few weeks, bifocals.    Pertinent History asthma, GERD, HLD, glaucoma, prediabetes    Limitations Other (comment)   sleeping; work   Diagnostic tests MRI today; Ophthalmology moved to January the 4th    Currently in Pain? Yes    Pain Score 4     Pain Location Neck    Pain Orientation Left               Vestibular Treatment/Exercise - 04/19/21 1041       Vestibular Treatment/Exercise   Vestibular Treatment Provided Gaze    Habituation Exercises Standing Horizontal Head Turns;Standing Vertical Head Turns;Comment     Gaze Exercises X1 Viewing Horizontal;X1 Viewing Vertical      Standing Horizontal Head Turns   Number of Reps  7    Symptom Description  no head turns, just eye movements (smooth pursuits) in standing; no symptoms.  Also performed seated near-far smooth pursuit standing with wide BOS x 7 reps, mild symptoms      Standing Vertical Head Turns   Number of Reps  5    Symptom Description  no head nod; just vertical smooth pursuit, pt began to feel more woozy, not able to complete all 7 reps      X1 Viewing Horizontal   Foot Position seated with back support, focus on relaxation of shoulder and neck muscles    Reps 2    Comments 30 seconds, no dizziness      X1 Viewing Vertical   Foot Position seated with back support, focus on relaxation of shoulder and neck muscles    Reps 2    Comments 30 seconds, no dizziness                    PT Education - 04/19/21 1117     Education Details pt reports some hesitancy to do  exercises at home due to feeling nervous about symptoms afterwards; discussed brain's natural defense is to protect and use of gradual/graded exposure to challenge brain but not trigger protective response.  Discussed use of breathing for relaxation.  Updated habituation exercises to standing    Person(s) Educated Patient    Methods Explanation;Demonstration;Handout    Comprehension Verbalized understanding;Returned demonstration                 PT Long Term Goals - 03/01/21 2104       PT LONG TERM GOAL #1   Title Pt will demonstrate independence with final neck and visual/vestibular HEP    Baseline No HEP to date    Time 8    Period Weeks    Status New    Target Date 04/30/21      PT LONG TERM GOAL #2   Title Pt will demonstrate demonstrate 15-20 degree improvement in cervical flexion/extension; 10 deg improvement in lateral flexion and increase in rotation to 60 deg bilaterally to improve safety with driving    Baseline 09/98 deg flexion/extension;  20/10 R/L sidebending; 20/30 R/L rotation    Time 8    Period Weeks    Status New    Target Date 04/30/21      PT LONG TERM GOAL #3   Title Pt will report a decrease in nightly HA pain to 2/10 overall to improve sleep    Baseline 8/10 at night; disturbs sleep    Time 8    Period Weeks    Status New    Target Date 04/30/21      PT LONG TERM GOAL #4   Title Pt will report ability to perform work on computer and phone for 4-6 hours without increase in eye fatigue/headache    Baseline looking at computer and phone increase patient's HA due to light sensitivity    Time 8    Period Weeks    Status New    Target Date 04/30/21                   Plan - 04/19/21 1125     Clinical Impression Statement Progressed habituation exercises to standing today except slow VOR/x1 viewing due to increased mm guarding.  Pt required wide BOS in standing to maintain balance.  Will continue to incorporate breathing and head movements into more functional and challenging activities to facilitate decreased guarding as pt is able to tolerate.    Personal Factors and Comorbidities Comorbidity 2;Profession    Comorbidities asthma, GERD, HLD, glaucoma, prediabetes    Examination-Activity Limitations Lift;Sleep;Reach Overhead    Examination-Participation Restrictions Community Activity;Occupation    Stability/Clinical Decision Making Stable/Uncomplicated    Rehab Potential Good    PT Frequency 1x / week    PT Duration 8 weeks    PT Treatment/Interventions ADLs/Self Care Home Management;Cryotherapy;Moist Heat;Functional mobility training;Therapeutic activities;Therapeutic exercise;Balance training;Neuromuscular re-education;Manual techniques;Dry needling;Patient/family education;Passive range of motion;Taping;Visual/perceptual remediation/compensation;Vestibular    PT Next Visit Plan Breathing to reduce mm tension and guarding.  x1 viewing x 60 seconds in sitting.  Head movements with eyes closed sitting  > standing.  Walking with head turns.    Consulted and Agree with Plan of Care Patient             Patient will benefit from skilled therapeutic intervention in order to improve the following deficits and impairments:  Decreased activity tolerance, Decreased range of motion, Impaired vision/preception, Pain  Visit Diagnosis: Cervicalgia  Intractable chronic post-traumatic headache  Problem List Patient Active Problem List   Diagnosis Date Noted   Concussion with no loss of consciousness 10/20/2020   Post-concussion headache 10/20/2020   Prediabetes 02/16/2020   Hyperlipidemia 02/16/2020   GERD (gastroesophageal reflux disease)    Obesity (BMI 30.0-34.9) 06/02/2019   Glaucoma 06/02/2019    Dierdre Highman, PT, DPT 04/19/21    11:32 AM    [facility] [address] [place], [place], [address] Phone: [phone]   Fax:  [phone]  Name: RYEN RHAMES MRN: [id number] Date of Birth: 1975-09-10

## 2021-04-23 ENCOUNTER — Encounter: Payer: Self-pay | Admitting: Speech Pathology

## 2021-04-23 ENCOUNTER — Other Ambulatory Visit: Payer: Self-pay

## 2021-04-23 ENCOUNTER — Ambulatory Visit: Payer: Medicaid Other | Admitting: Speech Pathology

## 2021-04-23 DIAGNOSIS — R4701 Aphasia: Secondary | ICD-10-CM

## 2021-04-23 DIAGNOSIS — M542 Cervicalgia: Secondary | ICD-10-CM | POA: Diagnosis not present

## 2021-04-23 DIAGNOSIS — R41841 Cognitive communication deficit: Secondary | ICD-10-CM

## 2021-04-23 NOTE — Therapy (Signed)
[facility] [address] [place], [place], [address] Phone: [phone]   Fax:  [phone]  Speech Language Pathology Treatment  Patient Details  Name: Tom Miranda MRN: [id number] Date of Birth: 04/16/1975 Referring Provider (SLP): Genia Harold, MD   Encounter Date: 04/23/2021   End of Session - 04/23/21 1208     Visit Number 3    Number of Visits 17    Date for SLP Re-Evaluation 06/01/21    Authorization Type Medicaid Healthy Blue    Authorization Time Period requested 15 visits on 04-06-21    Authorization - Visit Number 2    Authorization - Number of Visits 15    SLP Start Time 0930    SLP Stop Time  [id number]    SLP Time Calculation (min) 45 min    Activity Tolerance Patient tolerated treatment well             Past Medical History:  Diagnosis Date   Asthma    GERD (gastroesophageal reflux disease)    Hyperlipidemia     Past Surgical History:  Procedure Laterality Date   No prior surgery      There were no vitals filed for this visit.   Subjective Assessment - 04/23/21 0939     Subjective "No speech HW"    Currently in Pain? No/denies                   ADULT SLP TREATMENT - 04/23/21 0940       General Information   Behavior/Cognition Alert;Cooperative;Pleasant mood      Treatment Provided   Treatment provided Cognitive-Linquistic      Cognitive-Linquistic Treatment   Treatment focused on Cognition;Patient/family/caregiver education    Skilled Treatment Mildly complex naming with given letters to target word finding - Nur named 9/32 independently, he named 62 with mod semantic and 1st letter cues.And required total A to name 7. [place] misplaced his therapy binder, but did implement a hook by his door for his keys and has had success not loosing his keys since this. [place]ier has also carried over working from in a [employer] and setting priorities. He has told 2 family  members he cannot help them this week and had is older son pick up and babysit his younger children. He did burn pizza, then burn hand getting the pizza out of the over. Instructed him to use a timer.      Assessment / Recommendations / Plan   Plan Continue with current plan of care      Progression Toward Goals   Progression toward goals Progressing toward goals              SLP Education - 04/23/21 1202     Education Details compensations for attention, processing, word finding    Person(s) Educated Patient    Methods Explanation;Demonstration;Verbal cues;Handout    Comprehension Verbalized understanding;Returned demonstration;Need further instruction              SLP Short Term Goals - 04/23/21 1205       SLP SHORT TERM GOAL #1   Title Pt will complete CLQT and cognitive communication PROM in first ST session    Baseline 16 - Cognition Function Short Form    Time 4    Period Weeks    Status Achieved      SLP SHORT TERM GOAL #2   Title Pt will implement attention and memory compensations to aid daily functioning given occasional  min A over 2 sessions    Baseline Not effectively compensating for deficits      04/23/21;    Time 3    Period Weeks    Status On-going      SLP SHORT TERM GOAL #3   Title Pt will implement memory compensations to aid appointment management to attend 3/3 therapy sessions on time    Baseline Pt missed 2 ST evals    Time 3    Period Weeks    Status On-going      SLP SHORT TERM GOAL #4   Title Pt will use anomia compensations to aid word finding in 5 minute conversations given occasional min A over 2 sessions    Baseline word finding diffculty    Time 3    Period Weeks    Status On-going      SLP SHORT TERM GOAL #5   Title Pt will utilize comprehension techniques to aid auditory comprehension during 5-10 minute video/presentation given occasional min A over 2 sessions    Baseline difficulty comprehending conversations    Time 3     Period Weeks    Status On-going              SLP Long Term Goals - 04/23/21 1207       SLP LONG TERM GOAL #1   Title Pt will implement attention and memory compensations to aid daily functioning given rare min A over 2 sessions    Baseline Not compensating for deficits    Time 7    Period Weeks    Status On-going      SLP LONG TERM GOAL #2   Title Pt will effectively manage appointments with no missed appointments reported over 2 weeks    Baseline Missed appointments    Time 7    Period Weeks    Status On-going      SLP LONG TERM GOAL #3   Title Pt will present work related presentation with use of anomia compensations given rare min A over 2 sessions    Baseline difficulty speaking during work presentations    Time 7    Period Weeks    Status On-going      SLP LONG TERM GOAL #4   Title Pt will write 2 short stories with cohesive storyline and rare spelling errors given rare min A    Baseline difficulty writing reported    Time 7    Period Weeks    Status On-going              Plan - 04/23/21 1202     Clinical Impression Statement Lue has carried over 3 strategies as compensations for cogntivie impairments (Key hook at the door, finding quiet space to work, prioritzing tasks and eliminating non priorities) with success. Moderate cognitive impairments and mild word finding difficulty persist. Targeted compensations for naming and using mininotebook to write down things he needs to remember in the moment. Poor sleep persists. Instructed him to call [facility] about this. Continue skilled ST to maximize cognition and communication for success at work and return to [place] Frequency 2x / week    Duration 8 weeks    Treatment/Interventions SLP instruction and feedback;Internal/external aids;Compensatory techniques;Language facilitation;Cognitive reorganization;Multimodal communcation approach;Functional tasks;Compensatory strategies;Patient/family  education;Cueing hierarchy    Potential to Achieve Goals Good             Patient will benefit from skilled therapeutic intervention in order to improve  the following deficits and impairments:   Aphasia  Cognitive communication deficit    Problem List Patient Active Problem List   Diagnosis Date Noted   Concussion with no loss of consciousness 10/20/2020   Post-concussion headache 10/20/2020   Prediabetes 02/16/2020   Hyperlipidemia 02/16/2020   GERD (gastroesophageal reflux disease)    Obesity (BMI 30.0-34.9) 06/02/2019   Glaucoma 06/02/2019    Joseh Sjogren, Annye Rusk, CCC-SLP 04/23/2021, 12:09 PM  [facility] [address] [place], [place], [address] Phone: [phone]   Fax:  [phone]   Name: JANAI WEEK MRN: [id number] Date of Birth: Feb 17, 1976

## 2021-04-23 NOTE — Patient Instructions (Addendum)
° ° ° °  Call Ihor Austin and tell her you are not sleeping  [place] job using a hotel to get quiet - I will challenge you to give your brain a total break which means quiet 15 minutes every 60 (turn Netflix off)  Use a timer in the kitchen or order food  Find a guided meditation on Youtube and do these regularly (instead of TV)  Taking breaks in quiet dark rooms will reduce headaches and help you to function better throughout the day  Noise cancelling headphones are OK when you need to concentrate as well  Sunglasses can be helpful as well  (even indoors) if you are having a bad headache day  If an adult kid needs a ride or grandkid picked up, pay for their Benedetto Goad rather than trying to   [place] job prioritizing and saying no to some things. Great idea to pay your son to pick up and entertain the littles   I have a brain injury.   I am having difficulty paying attention and processing.  I need a quiet environment - please turn off the radio and TV  I want to help you, but I am not able to do that today   Please provide the information in writing.

## 2021-04-25 IMAGING — DX DG CHEST 2V
2 series · 2 of 2 positions shown · non-contrast
Comparison: PA and lateral chest 01/09/2015.

CLINICAL DATA: Chest pain for 4 days.

EXAM:
CHEST - 2 VIEW

[w chest pa]
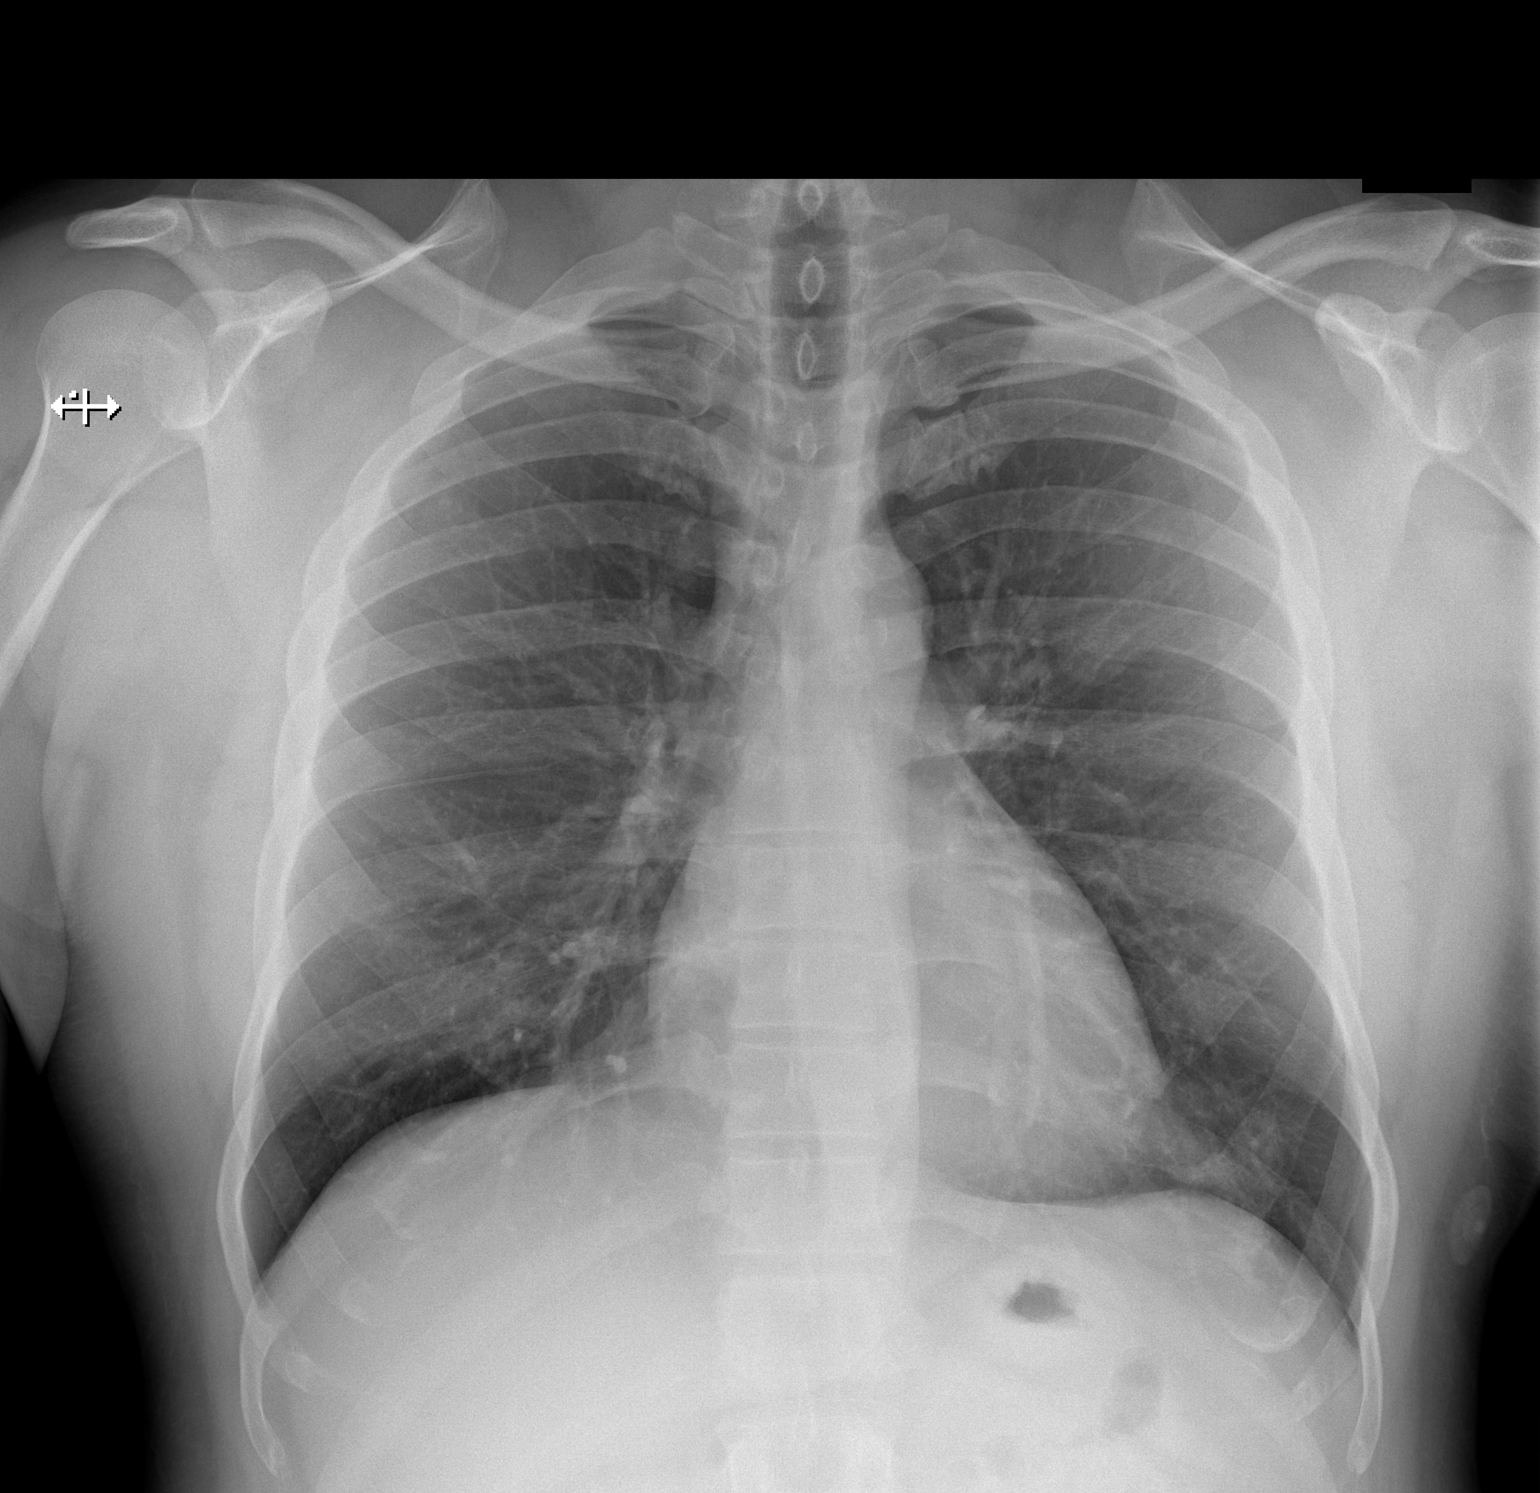

[w chest lat]
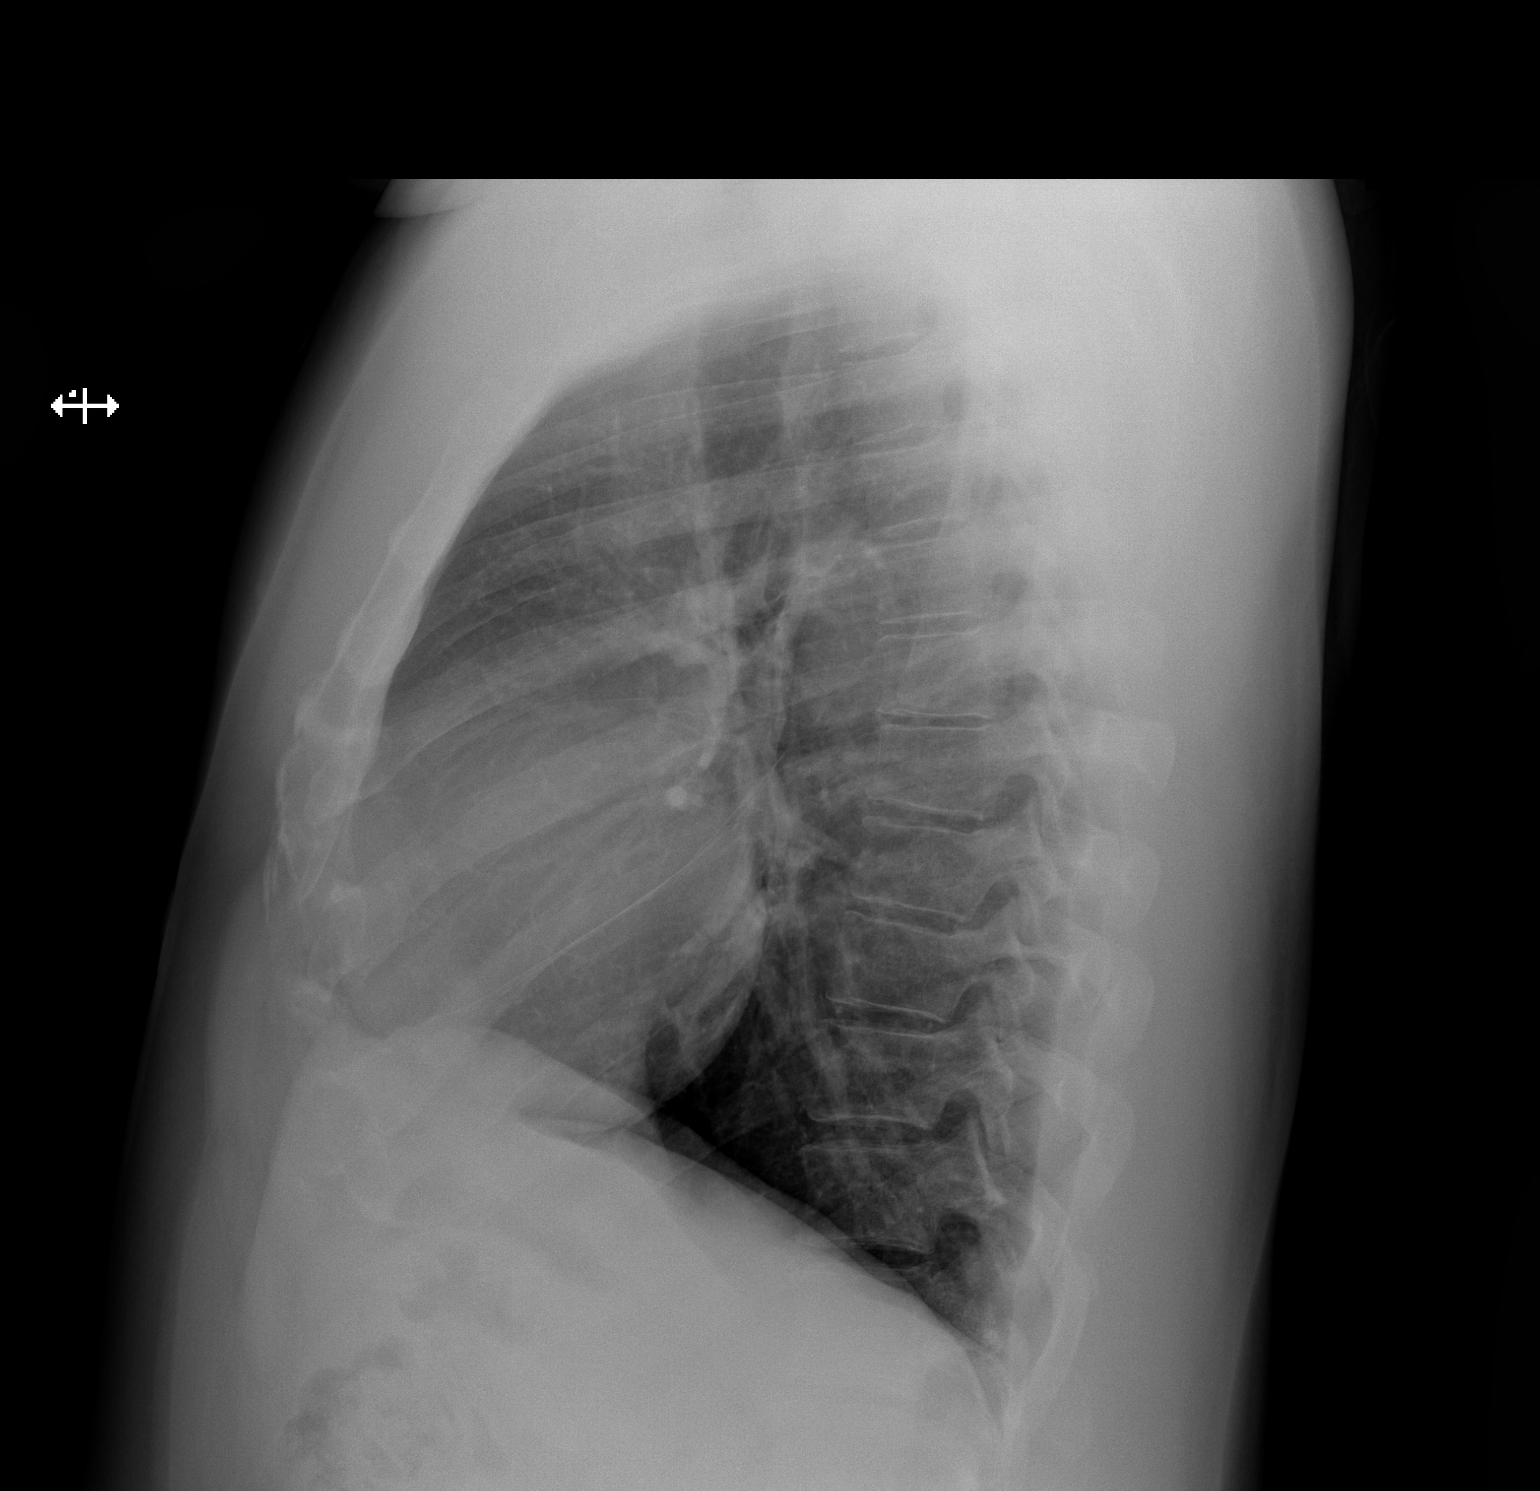

[2 of 2 positions shown; findings below may reference images not displayed]

FINDINGS: Lungs clear. Heart size normal. No pneumothorax or pleural fluid. No
acute or focal bony abnormality.
IMPRESSION: Normal chest.

## 2021-04-26 ENCOUNTER — Ambulatory Visit: Payer: Medicaid Other | Admitting: Speech Pathology

## 2021-04-26 ENCOUNTER — Telehealth: Payer: Self-pay | Admitting: Psychiatry

## 2021-04-26 ENCOUNTER — Other Ambulatory Visit: Payer: Self-pay

## 2021-04-26 ENCOUNTER — Ambulatory Visit: Payer: Medicaid Other | Admitting: Physical Therapy

## 2021-04-26 ENCOUNTER — Encounter: Payer: Self-pay | Admitting: Speech Pathology

## 2021-04-26 DIAGNOSIS — M542 Cervicalgia: Secondary | ICD-10-CM | POA: Diagnosis not present

## 2021-04-26 DIAGNOSIS — R4701 Aphasia: Secondary | ICD-10-CM

## 2021-04-26 DIAGNOSIS — R41841 Cognitive communication deficit: Secondary | ICD-10-CM

## 2021-04-26 DIAGNOSIS — G44321 Chronic post-traumatic headache, intractable: Secondary | ICD-10-CM

## 2021-04-26 NOTE — Therapy (Signed)
[facility] [address] [place], [place], [address] Phone: [phone]   Fax:  [phone]  Physical Therapy Treatment  Patient Details  Name: Tom Miranda MRN: [id number] Date of Birth: October 23, 1975 Referring Provider (PT): Ocie Doyne, MD   Encounter Date: 04/26/2021   PT End of Session - 04/26/21 1117     Visit Number 5    Number of Visits 9    Date for PT Re-Evaluation 04/30/21    Authorization Type Medicaid Healthy Blue    Authorization - Visit Number 4    Authorization - Number of Visits 8    PT Start Time 1016    PT Stop Time 1100    PT Time Calculation (min) 44 min    Activity Tolerance Patient tolerated treatment well    Behavior During Therapy WFL for tasks assessed/performed             Past Medical History:  Diagnosis Date   Asthma    GERD (gastroesophageal reflux disease)    Hyperlipidemia     Past Surgical History:  Procedure Laterality Date   No prior surgery      There were no vitals filed for this visit.   Subjective Assessment - 04/26/21 1022     Subjective Has had a good week; wearing sunglasses today.  Speech still going well.  Did the standing exercises once this week.    Pertinent History asthma, GERD, HLD, glaucoma, prediabetes    Limitations Other (comment)   sleeping; work   Diagnostic tests MRI today; Ophthalmology moved to January the 4th    Currently in Pain? Yes    Pain Score 4     Pain Location Neck    Pain Orientation Right;Left    Pain Descriptors / Indicators Other (Comment)   tense   Pain Type Chronic pain               [facility] Adult PT Treatment/Exercise - 04/26/21 1047       Manual Therapy   Manual Therapy Myofascial release;Passive ROM;Manual Traction    Manual therapy comments Performed in supine    Myofascial Release Suboccipital release with deep breathing and allowing neck mm to release and let head be "heavy"    Passive ROM For R and L  upper trap with therapist providing overpressure at each shoulder    Manual Traction following suboccipital release, performed light manual upper cervical traction adding in active, small range chin tuck retractions, rotation to R and L x 10 reps prior to performing UT stretch             Vestibular Treatment/Exercise - 04/26/21 1050       Vestibular Treatment/Exercise   Vestibular Treatment Provided Gaze    Habituation Exercises Seated Horizontal Head Turns;Seated Vertical Head Turns    Gaze Exercises X1 Viewing Horizontal;X1 Viewing Vertical      Seated Horizontal Head Turns   Number of Reps  10    Symptom Description  warm up and increased volitional neck ROM for x1 viewing after manual therapy      Seated Vertical Head Turns   Number of Reps  10    Symptom Description  warm up and increased volitional neck ROM for x1 viewing after manual therapy      X1 Viewing Horizontal   Foot Position seated without back support    Reps 1    Comments 30 seconds, mild HA      X1 Viewing Vertical  Foot Position seated without back support    Reps 1    Comments 30 seconds, mild HA                PT Education - 04/26/21 1117     Education Details no change to x1 viewing; added self suboccipital release with tennis balls for home    Person(s) Educated Patient    Methods Explanation    Comprehension Verbalized understanding                 PT Long Term Goals - 03/01/21 2104       PT LONG TERM GOAL #1   Title Pt will demonstrate independence with final neck and visual/vestibular HEP    Baseline No HEP to date    Time 8    Period Weeks    Status New    Target Date 04/30/21      PT LONG TERM GOAL #2   Title Pt will demonstrate demonstrate 15-20 degree improvement in cervical flexion/extension; 10 deg improvement in lateral flexion and increase in rotation to 60 deg bilaterally to improve safety with driving    Baseline 16/1015/20 deg flexion/extension; 20/10 R/L  sidebending; 20/30 R/L rotation    Time 8    Period Weeks    Status New    Target Date 04/30/21      PT LONG TERM GOAL #3   Title Pt will report a decrease in nightly HA pain to 2/10 overall to improve sleep    Baseline 8/10 at night; disturbs sleep    Time 8    Period Weeks    Status New    Target Date 04/30/21      PT LONG TERM GOAL #4   Title Pt will report ability to perform work on computer and phone for 4-6 hours without increase in eye fatigue/headache    Baseline looking at computer and phone increase patient's HA due to light sensitivity    Time 8    Period Weeks    Status New    Target Date 04/30/21                   Plan - 04/26/21 1121     Clinical Impression Statement Due to ongoing neck tension and mm guarding when performing vestibular exercises PT utilized manual therapy to begin to address to prepare for habituation and x1 exercises.  Pt demonstrated increased AROM but did not progress HEP due to onset of HA.    Personal Factors and Comorbidities Comorbidity 2;Profession    Comorbidities asthma, GERD, HLD, glaucoma, prediabetes    Examination-Activity Limitations Lift;Sleep;Reach Overhead    Examination-Participation Restrictions Community Activity;Occupation    Stability/Clinical Decision Making Stable/Uncomplicated    Rehab Potential Good    PT Frequency 1x / week    PT Duration 8 weeks    PT Treatment/Interventions ADLs/Self Care Home Management;Cryotherapy;Moist Heat;Functional mobility training;Therapeutic activities;Therapeutic exercise;Balance training;Neuromuscular re-education;Manual techniques;Dry needling;Patient/family education;Passive range of motion;Taping;Visual/perceptual remediation/compensation;Vestibular    PT Next Visit Plan How are tennis balls working?  Check LTG, recert and ask for more PT visits.  Breathing to reduce mm tension and guarding.  x1 viewing x 60 seconds in sitting.  Head movements with eyes closed sitting > standing.   Walking with head turns.    Consulted and Agree with Plan of Care Patient             Patient will benefit from skilled therapeutic intervention in order to improve the following deficits  and impairments:  Decreased activity tolerance, Decreased range of motion, Impaired vision/preception, Pain  Visit Diagnosis: Cervicalgia  Intractable chronic post-traumatic headache     Problem List Patient Active Problem List   Diagnosis Date Noted   Concussion with no loss of consciousness 10/20/2020   Post-concussion headache 10/20/2020   Prediabetes 02/16/2020   Hyperlipidemia 02/16/2020   GERD (gastroesophageal reflux disease)    Obesity (BMI 30.0-34.9) 06/02/2019   Glaucoma 06/02/2019    Dierdre Highman, PT, DPT 04/26/21    11:25 AM   [facility] [address] [place], [place], [address] Phone: [phone]   Fax:  [phone]  Name: Tom Miranda MRN: [id number] Date of Birth: 1975/10/30

## 2021-04-26 NOTE — Telephone Encounter (Signed)
Pt came in because he is having problems with his medication (gabapentin (NEURONTIN) 300 MG capsule [id number]), he says the medicine isn't working.

## 2021-04-26 NOTE — Telephone Encounter (Signed)
Any other suggestions? Please advise  °

## 2021-04-26 NOTE — Patient Instructions (Addendum)
° °  When you are traveling to [place] try not to take calls   Mindfulness when you are having internal distractions  App: Breathe2Relax - in a dark room or eyes  Practice pitching several times  Keep setting boundries with the olders  When you get snippy, notice how your body feels and your head feels - try to  remove yourself into a quiet space before you become snippy  IG - Concussion  Concussion_doc on Insta  Call neurologist tomorrow about your sleep if you don't hear from them today. Sleep is important  Consider using as your notebook

## 2021-04-26 NOTE — Telephone Encounter (Signed)
He can add a morning dose so he's taking 300 mg in the morning and 900 mg at bedtime. If he's tolerating that well without side effects for a week he can add an afternoon dose (300 mg in the morning, 300 mg in the afternoon, 900 mg at bedtime)

## 2021-04-26 NOTE — Telephone Encounter (Signed)
Contacted pt, informed him He can add a morning dose so he's taking 300 mg in the morning and 900 mg at bedtime. If he's tolerating that well without side effects for a week he can add an afternoon dose (300 mg in the morning, 300 mg in the afternoon, 900 mg at bedtime. Will also send to his mychart for reference. He was appreciative

## 2021-04-26 NOTE — Therapy (Signed)
[facility] [address] [place], [place], [address] Phone: [phone]   Fax:  [phone]  Speech Language Pathology Treatment  Patient Details  Name: HRIDAAN TRAW MRN: [id number] Date of Birth: 10-09-1975 Referring Provider (SLP): Genia Harold, MD   Encounter Date: 04/26/2021   End of Session - 04/26/21 1234     Visit Number 4    Number of Visits 17    Date for SLP Re-Evaluation 06/01/21    Authorization Type Medicaid Healthy Blue    Authorization Time Period requested 15 visits on 04-06-21    Authorization - Visit Number 3    Authorization - Number of Visits 15    SLP Start Time 1100    SLP Stop Time  1145    SLP Time Calculation (min) 45 min    Activity Tolerance Patient tolerated treatment well             Past Medical History:  Diagnosis Date   Asthma    GERD (gastroesophageal reflux disease)    Hyperlipidemia     Past Surgical History:  Procedure Laterality Date   No prior surgery      There were no vitals filed for this visit.   Subjective Assessment - 04/26/21 1107     Subjective "I need to go back in"    Currently in Pain? Yes    Pain Score 5     Pain Location Neck    Pain Orientation Right;Left    Pain Descriptors / Indicators Discomfort    Pain Type Chronic pain    Pain Onset 1 to 4 weeks ago                   ADULT SLP TREATMENT - 04/26/21 1109       General Information   Behavior/Cognition Alert;Cooperative;Pleasant mood      Treatment Provided   Treatment provided Cognitive-Linquistic      Cognitive-Linquistic Treatment   Treatment focused on Cognition;Patient/family/caregiver education    Skilled Treatment Aydenn has started some meditation which he states does help him. He is wearing suglasses due to having a hard. He  continues to demonstrate word finding episdes persist. Trained pt in verbal compensations for word finding including use of synonyms and  descriptions. In simple conversation, Elmar used descriptions and synonym 5x with rare min A. 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 Satya required extended time and had 6 word finding episodes during his pitch and required verbal cues to name 3/6 of the gummies flavors. 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 Instructed him to continue to practice his pitch. He used customer as he could not come up with "consumer" whch he feels consumer is a more professional word. Instructed to generate short list of professional words he uses when he pitches      Assessment / Recommendations / Mack with current plan of care      Progression Toward Goals   Progression toward goals Progressing toward goals              SLP Education - 04/26/21 1232     Education Details compensations for dysnomia, practice your pitch    Person(s) Educated Patient    Methods Explanation;Demonstration;Verbal cues;Handout    Comprehension Verbalized understanding;Returned demonstration;Verbal cues required              SLP Short Term Goals - 04/26/21 1233  SLP SHORT TERM GOAL #1   Title Pt will complete CLQT and cognitive communication PROM in first ST session    Baseline 16 - Cognition Function Short Form    Time 4    Period Weeks    Status Achieved      SLP SHORT TERM GOAL #2   Title Pt will implement attention and memory compensations to aid daily functioning given occasional min A over 2 sessions    Baseline Not effectively compensating for deficits      04/23/21;    Time 3    Period Weeks    Status On-going      SLP SHORT TERM GOAL #3   Title Pt will implement memory compensations to aid appointment management to attend 3/3 therapy sessions on time    Baseline Pt missed 2 ST evals    Time 3    Period Weeks    Status On-going      SLP SHORT TERM GOAL #4   Title Pt will use anomia compensations to aid word finding in 5 minute conversations given occasional min  A over 2 sessions    Baseline word finding diffculty    Time 3    Period Weeks    Status On-going      SLP SHORT TERM GOAL #5   Title Pt will utilize comprehension techniques to aid auditory comprehension during 5-10 minute video/presentation given occasional min A over 2 sessions    Baseline difficulty comprehending conversations    Time 3    Period Weeks    Status On-going              SLP Long Term Goals - 04/26/21 1234       SLP LONG TERM GOAL #1   Title Pt will implement attention and memory compensations to aid daily functioning given rare min A over 2 sessions    Baseline Not compensating for deficits    Time 7    Period Weeks    Status On-going      SLP LONG TERM GOAL #2   Title Pt will effectively manage appointments with no missed appointments reported over 2 weeks    Baseline Missed appointments    Time 7    Period Weeks    Status On-going      SLP LONG TERM GOAL #3   Title Pt will present work related presentation with use of anomia compensations given rare min A over 2 sessions    Baseline difficulty speaking during work presentations    Time 7    Period Weeks    Status On-going      SLP LONG TERM GOAL #4   Title Pt will write 2 short stories with cohesive storyline and rare spelling errors given rare min A    Baseline difficulty writing reported    Time 7    Period Weeks    Status On-going              Plan - 04/26/21 1233     Clinical Impression Statement Kvaughn has carried over 3 strategies as compensations for cogntivie impairments (Key hook at the door, finding quiet space to work, prioritzing tasks and eliminating non priorities) with success. Moderate cognitive impairments and mild word finding difficulty persist. Targeted compensations for naming and using mininotebook to write down things he needs to remember in the moment. Poor sleep persists. Instructed him to call [facility] about this. Continue skilled ST to maximize cognition and  communication for  success at work and return to [employer] Frequency 2x / week    Duration 8 weeks    Treatment/Interventions SLP instruction and feedback;Internal/external aids;Compensatory techniques;Language facilitation;Cognitive reorganization;Multimodal communcation approach;Functional tasks;Compensatory strategies;Patient/family education;Cueing hierarchy    Potential to Achieve Goals Good             Patient will benefit from skilled therapeutic intervention in order to improve the following deficits and impairments:   Cognitive communication deficit  Aphasia    Problem List Patient Active Problem List   Diagnosis Date Noted   Concussion with no loss of consciousness 10/20/2020   Post-concussion headache 10/20/2020   Prediabetes 02/16/2020   Hyperlipidemia 02/16/2020   GERD (gastroesophageal reflux disease)    Obesity (BMI 30.0-34.9) 06/02/2019   Glaucoma 06/02/2019    Seniya Stoffers, Annye Rusk, [place] 04/26/2021, 12:35 PM  [facility] [address] [place], [place], [address] Phone: [phone]   Fax:  [phone]   Name: ZAKIAH HADE MRN: [id number] Date of Birth: 1976/02/06

## 2021-04-26 NOTE — Patient Instructions (Signed)
Access Code: [facility] URL: [email or web address] Date: 04/26/2021 Prepared by: Bufford Lope  Exercises Seated Assisted Cervical Rotation with Towel - 1 x daily - 7 x weekly - 5 reps - 5-6 second hold Seated Cervical Sidebending Stretch - 1 x daily - 7 x weekly - 2 sets - 30 second hold Seated Shoulder Rolls - 1 x daily - 7 x weekly - 2 sets - 10 reps Seated Proximal-Distal Smooth Pursuit - 1 x daily - 7 x weekly - 1 sets - 7 reps STANDING FEET WIDE - EYES MOVE SIDE TO SIDE; UP AND DOWN - 1 x daily - 7 x weekly - 1 sets - 7 reps Supine Suboccipital Release with Tennis Balls - 1 x daily - 7 x weekly - 2 minute hold  Gaze Stabilization: Sitting    Keeping eyes on target on wall 3 feet away, tilt head down 15-30 and move head side to side for __30__ seconds. Repeat while moving head up and down for __30__ seconds.  Do __2__ sessions per day.

## 2021-05-01 ENCOUNTER — Ambulatory Visit: Payer: Medicaid Other

## 2021-05-01 ENCOUNTER — Other Ambulatory Visit: Payer: Self-pay

## 2021-05-01 DIAGNOSIS — R41841 Cognitive communication deficit: Secondary | ICD-10-CM

## 2021-05-01 DIAGNOSIS — R4701 Aphasia: Secondary | ICD-10-CM

## 2021-05-01 DIAGNOSIS — M542 Cervicalgia: Secondary | ICD-10-CM | POA: Diagnosis not present

## 2021-05-01 NOTE — Therapy (Signed)
[facility] [address] [place], [place], [address] Phone: [phone]   Fax:  [phone]  Speech Language Pathology Treatment  Patient Details  Name: Tom Miranda MRN: [id number] Date of Birth: 05/05/1975 Referring Provider (SLP): Ocie Doynehima, Jennifer, MD   Encounter Date: 05/01/2021   End of Session - 05/01/21 1019     Visit Number 5    Number of Visits 17    Date for SLP Re-Evaluation 06/01/21    Authorization Type Medicaid Healthy Blue    Authorization Time Period requested 15 visits on 04-06-21    Authorization - Visit Number 4    Authorization - Number of Visits 15    SLP Start Time 1019    SLP Stop Time  1100    SLP Time Calculation (min) 41 min    Activity Tolerance Patient tolerated treatment well             Past Medical History:  Diagnosis Date   Asthma    GERD (gastroesophageal reflux disease)    Hyperlipidemia     Past Surgical History:  Procedure Laterality Date   No prior surgery      There were no vitals filed for this visit.   Subjective Assessment - 05/01/21 1019     Subjective "it's doing pretty good"    Currently in Pain? No/denies                   ADULT SLP TREATMENT - 05/01/21 1018       General Information   Behavior/Cognition Alert;Cooperative;Pleasant mood      Treatment Provided   Treatment provided Cognitive-Linquistic      Cognitive-Linquistic Treatment   Treatment focused on Cognition;Aphasia    Skilled Treatment Pt indicated word finding is "better." Pt reportedly using Google to look up synonyms when he can't identify them independently. SLP educated pt on being mindful of over using external aid and trying to self-cue and compensate with targeted strategies. Pt verbalized understanding. Sales pitch over the weekend when "better than expected" as pt implemented reherseal. Pt did not generate list of work related terms as recommended last session. Today,  pt generated list of x10 items with usual extra processing time. SLP cued description x1 when word finding occured, which was effective. SLP inquired about misplacing keys and wallet, in which pt reportedly walked off with family member's keys 5 days ago. With minimal prompting, pt identified need to write reminder in phone that will alert him after ST session is over to return keys.      Assessment / Recommendations / Plan   Plan Continue with current plan of care      Progression Toward Goals   Progression toward goals Progressing toward goals              SLP Education - 05/01/21 1046     Education Details anomia compensations, write down work terms in your phone    Person(s) Educated Patient    Methods Explanation;Demonstration;Verbal cues    Comprehension Verbalized understanding;Returned demonstration;Need further instruction              SLP Short Term Goals - 05/01/21 1040       SLP SHORT TERM GOAL #1   Title Pt will complete CLQT and cognitive communication PROM in first ST session    Baseline 16 - Cognition Function Short Form    Status Achieved      SLP SHORT TERM GOAL #2   Title  Pt will implement attention and memory compensations to aid daily functioning given occasional min A over 2 sessions    Baseline Not effectively compensating for deficits      04/23/21; 05-01-21    Status Achieved      SLP SHORT TERM GOAL #3   Title Pt will implement memory compensations to aid appointment management to attend 3/3 therapy sessions on time    Baseline Pt missed 2 ST evals    Time 2    Period Weeks    Status On-going      SLP SHORT TERM GOAL #4   Title Pt will use anomia compensations to aid word finding in 5 minute conversations given occasional min A over 2 sessions    Baseline word finding diffculty    Time 2    Period Weeks    Status On-going      SLP SHORT TERM GOAL #5   Title Pt will utilize comprehension techniques to aid auditory comprehension during 5-10  minute video/presentation given occasional min A over 2 sessions    Baseline difficulty comprehending conversations    Time 2    Period Weeks    Status On-going              SLP Long Term Goals - 05/01/21 1041       SLP LONG TERM GOAL #1   Title Pt will implement attention and memory compensations to aid daily functioning given rare min A over 2 sessions    Baseline Not compensating for deficits    Time 6    Period Weeks    Status On-going      SLP LONG TERM GOAL #2   Title Pt will effectively manage appointments with no missed appointments reported over 2 weeks    Baseline Missed appointments    Time 6    Period Weeks    Status On-going      SLP LONG TERM GOAL #3   Title Pt will present work related presentation with use of anomia compensations given rare min A over 2 sessions    Baseline difficulty speaking during work presentations    Time 6    Period Weeks    Status On-going      SLP LONG TERM GOAL #4   Title Pt will write 2 short stories with cohesive storyline and rare spelling errors given rare min A    Baseline difficulty writing reported    Time 6    Period Weeks    Status On-going              Plan - 05/01/21 1043     Clinical Impression Statement Merric has carried over 2 compensatory strategies to aid recall and attention with success. Moderate cognitive impairments and mild word finding difficulty persist. Targeted compensations for naming and using phone to write down things he needs to remember in the moment (i.e., relevant work terms, reminders for necessary to-do items).  Continue skilled ST to maximize cognition and communication for success at work and return to [employer]    Speech Therapy Frequency 2x / week    Duration 8 weeks    Treatment/Interventions SLP instruction and feedback;Internal/external aids;Compensatory techniques;Language facilitation;Cognitive reorganization;Multimodal communcation approach;Functional tasks;Compensatory  strategies;Patient/family education;Cueing hierarchy    Potential to Achieve Goals Good             Patient will benefit from skilled therapeutic intervention in order to improve the following deficits and impairments:   Aphasia  Cognitive communication deficit  Problem List Patient Active Problem List   Diagnosis Date Noted   Concussion with no loss of consciousness 10/20/2020   Post-concussion headache 10/20/2020   Prediabetes 02/16/2020   Hyperlipidemia 02/16/2020   GERD (gastroesophageal reflux disease)    Obesity (BMI 30.0-34.9) 06/02/2019   Glaucoma 06/02/2019    Janann Colonel, CCC-SLP 05/01/2021, 12:51 PM  [facility] [address] [place], [place], [address] Phone: [phone]   Fax:  [phone]   Name: BARAA TUBBS MRN: [id number] Date of Birth: [birth year]

## 2021-05-01 NOTE — Patient Instructions (Addendum)
Identify better synonym for: Special  Friend Angry  [place]  Love

## 2021-05-03 ENCOUNTER — Ambulatory Visit: Payer: Medicaid Other

## 2021-05-03 ENCOUNTER — Other Ambulatory Visit: Payer: Self-pay

## 2021-05-03 ENCOUNTER — Ambulatory Visit: Payer: Medicaid Other | Admitting: Physical Therapy

## 2021-05-03 DIAGNOSIS — M542 Cervicalgia: Secondary | ICD-10-CM

## 2021-05-03 DIAGNOSIS — G44321 Chronic post-traumatic headache, intractable: Secondary | ICD-10-CM

## 2021-05-03 DIAGNOSIS — R41841 Cognitive communication deficit: Secondary | ICD-10-CM

## 2021-05-03 DIAGNOSIS — R4701 Aphasia: Secondary | ICD-10-CM

## 2021-05-03 NOTE — Therapy (Signed)
[facility] [address] [place], [place], [address] Phone: [phone]   Fax:  [phone]  Physical Therapy Treatment  Patient Details  Name: Tom Miranda MRN: [id number] Date of Birth: 1975/06/11 Referring Provider (Tom Miranda): Genia Harold, MD   Encounter Date: 05/03/2021   Tom Miranda End of Session - 05/03/21 1115     Visit Number 6    Number of Visits 12    Date for Tom Miranda Re-Evaluation 06/17/21    Authorization Type Medicaid Healthy Blue - have requested 6 more visits    Tom Miranda Start Time 1022    Tom Miranda Stop Time 1100    Tom Miranda Time Calculation (min) 38 min    Activity Tolerance Patient tolerated treatment well    Behavior During Therapy WFL for tasks assessed/performed             Past Medical History:  Diagnosis Date   Asthma    GERD (gastroesophageal reflux disease)    Hyperlipidemia     Past Surgical History:  Procedure Laterality Date   No prior surgery      There were no vitals filed for this visit.   Subjective Assessment - 05/03/21 1022     Subjective HA settled down after last session.  Was able to do the exercises a couple times this week; felt good.  Neck is feeling pretty good today.  No dizziness.  No headache.    Pertinent History asthma, GERD, HLD, glaucoma, prediabetes    Limitations Other (comment)   sleeping; work   Diagnostic tests MRI today; Ophthalmology moved to January the 4th    Currently in Pain? No/denies                [facility] Tom Miranda Assessment - 05/03/21 1026       Assessment   Medical Diagnosis Post-Concussion, neck pain, HA    Referring Provider (Tom Miranda) Genia Harold, MD    Onset Date/Surgical Date 02/12/21      Precautions   Precautions Other (comment)    Precaution Comments asthma, GERD, HLD, glaucoma, prediabetes      Prior Function   Level of Independence Independent      ROM / Strength   AROM / PROM / Strength AROM      AROM   AROM Assessment Site Cervical    Cervical  Flexion 40    Cervical Extension 30    Cervical - Right Side Bend 30    Cervical - Left Side Bend 25   tightness   Cervical - Right Rotation 50    Cervical - Left Rotation 50                 Vestibular Assessment - 05/03/21 1033       Positional Sensitivities   Nose to Right Knee No dizziness    Right Knee to Sitting No dizziness    Nose to Left Knee No dizziness    Left Knee to Sitting No dizziness    Head Turning x 5 No dizziness    Head Nodding x 5 No dizziness    Pivot Right in Standing No dizziness    Pivot Left in Standing No dizziness    Positional Sensitivities Comments No dizziness                [facility] Adult Tom Miranda Treatment/Exercise - 05/03/21 1110       Exercises   Exercises Other Exercises    Other Exercises  Reviewed and updated HEP today: performed smooth  pursuit habituation exercises increasing repetitions to 10 - performed in sitting today due to increase # of repetitions; Tom Miranda reported mild symptoms with vertical.  Also increased time to perform seated slow VOR x1 to 45 seconds; no symptoms with side to side, mild symptoms with up/down            Access Code: [facility] URL: [email or web address] Date: 05/03/2021 Prepared by: Misty Stanley  Exercises Seated Assisted Cervical Rotation with Towel - 1 x daily - 7 x weekly - 5 reps - 5-6 second hold Seated Cervical Sidebending Stretch - 1 x daily - 7 x weekly - 2 sets - 30 second hold Seated Shoulder Rolls - 1 x daily - 7 x weekly - 2 sets - 10 reps Seated Proximal-Distal Smooth Pursuit - 1 x daily - 7 x weekly - 1 sets - 7 reps STANDING FEET WIDE - EYES MOVE SIDE TO SIDE; UP AND DOWN - 1 x daily - 7 x weekly - 2 sets - 10 reps Supine Suboccipital Release with Tennis Balls - 1 x daily - 7 x weekly - 2 minute hold   Gaze Stabilization: Sitting    Keeping eyes on target on wall 3 feet away, tilt head down 15-30 and move head side to side for __45__ seconds. Repeat while moving head  up and down for __45__ seconds.  Do __2__ sessions per day.     Tom Miranda Education - 05/03/21 1113     Education Details progress towards goals, areas to continue to address, updated HEP    Person(s) Educated Patient    Methods Explanation;Demonstration;Handout    Comprehension Verbalized understanding;Returned demonstration                Tom Miranda Long Term Goals - 05/03/21 1039       Tom Miranda LONG TERM GOAL #1   Title Tom Miranda will demonstrate independence with final neck and visual/vestibular HEP    Baseline updated HEP today    Time 8    Period Weeks    Status On-going    Target Date 04/30/21      Tom Miranda LONG TERM GOAL #2   Title Tom Miranda will demonstrate demonstrate 15-20 degree improvement in cervical flexion/extension; 10 deg improvement in lateral flexion and increase in rotation to 60 deg bilaterally to improve safety with driving    Baseline partially met; rotation has increased to 50 deg bilaterally    Time 8    Period Weeks    Status Partially Met    Target Date 04/30/21      Tom Miranda LONG TERM GOAL #3   Title Tom Miranda will report a decrease in nightly HA pain to 2/10 overall to improve sleep    Baseline still getting HA at night 8/10; last night was first night without HA    Time 8    Period Weeks    Status Not Met    Target Date 04/30/21      Tom Miranda LONG TERM GOAL #4   Title Tom Miranda will report ability to perform work on computer and phone for 4-6 hours without increase in eye fatigue/headache    Baseline only able to look at computer/phone for 10-15 minutes at a time before eye fatigue/HA begin    Time 8    Period Weeks    Status Not Met    Target Date 04/30/21             New goals for recert:  Tom Miranda Long Term Goals - 05/03/21 1126  Tom Miranda LONG TERM GOAL #1   Title Tom Miranda will demonstrate independence with final neck and visual/vestibular HEP    Baseline updated HEP today    Time 6    Period Weeks    Status Revised    Target Date 06/17/21      Tom Miranda LONG TERM GOAL #2   Title Tom Miranda will  demonstrate increase in pain free neck rotation to 60 deg bilaterally to improve safety with driving    Baseline partially met; rotation has increased to 50 deg bilaterally    Time 6    Period Weeks    Status Revised    Target Date 06/17/21      Tom Miranda LONG TERM GOAL #3   Title Tom Miranda will report a consistent decrease in nightly HA pain to 2/10 overall to improve sleep    Baseline still getting HA at night 8/10; last night was first night without HA    Time 6    Period Weeks    Status Revised    Target Date 06/17/21      Tom Miranda LONG TERM GOAL #4   Title Tom Miranda will report ability to perform work on computer and phone for 4-6 hours without increase in eye fatigue/headache    Baseline only able to look at computer/phone for 10-15 minutes at a time before eye fatigue/HA begin    Time 6    Period Weeks    Status Revised    Target Date 06/17/21                  Plan - 05/03/21 1116     Clinical Impression Statement Performed assessment of progress towards LTG.  Tom Miranda is making steady progress and does report decreased symptoms today and improved sleep last night.  Tom Miranda has partially met neck ROM goals and demonstrates significant improvement in pain free AROM but did not reach goal for rotation ROM.  Tom Miranda also continues to experience significant eye fatigue and headaches with work activities and at night which continue to disrupt his sleep.  Patient's exercises updated today.  Will continue to address impairments in order to progress towards umet goals.    Personal Factors and Comorbidities Comorbidity 2;Profession    Comorbidities asthma, GERD, HLD, glaucoma, prediabetes    Examination-Activity Limitations Lift;Sleep;Reach Overhead    Examination-Participation Restrictions Community Activity;Occupation    Stability/Clinical Decision Making Stable/Uncomplicated    Rehab Potential Good    Tom Miranda Frequency 1x / week    Tom Miranda Duration 6 weeks    Tom Miranda Treatment/Interventions ADLs/Self Care Home  Management;Cryotherapy;Moist Heat;Functional mobility training;Therapeutic activities;Therapeutic exercise;Balance training;Neuromuscular re-education;Manual techniques;Dry needling;Patient/family education;Passive range of motion;Taping;Visual/perceptual remediation/compensation;Vestibular    Tom Miranda Next Visit Plan Eye appointment?  How are new exercises going at home? x1 viewing x 60 seconds in sitting or progress to standing.  Head movements with eyes closed sitting > standing.  Walking with head turns.    Consulted and Agree with Plan of Care Patient             Patient will benefit from skilled therapeutic intervention in order to improve the following deficits and impairments:  Decreased activity tolerance, Decreased range of motion, Impaired vision/preception, Pain  Visit Diagnosis: Cervicalgia  Intractable chronic post-traumatic headache     Problem List Patient Active Problem List   Diagnosis Date Noted   Concussion with no loss of consciousness 10/20/2020   Post-concussion headache 10/20/2020   Prediabetes 02/16/2020   Hyperlipidemia 02/16/2020   GERD (gastroesophageal reflux disease)    Obesity (  BMI 30.0-34.9) 06/02/2019   Glaucoma 06/02/2019    Tom Miranda, Tom Miranda, Tom Miranda 05/03/21    11:23 AM   [facility] [address] [place], [place], [address] Phone: [phone]   Fax:  [phone]  Name: Tom Miranda MRN: [id number] Date of Birth: 1975-12-26  .Managed medicaid CPT codes: [phone]- Therapeutic Exercise, [phone]- Neuro Re-education, [phone] - Gait Training, [id number] - Manual Therapy, [id number] - Therapeutic Activities, and [id number] - Self Care

## 2021-05-03 NOTE — Patient Instructions (Addendum)
Access Code: [facility] URL: [email or web address] Date: 04/26/2021 Prepared by: Bufford Lope  Increased smooth pursuits to 10 reps each side to side, up and down  Gaze Stabilization: Sitting    Keeping eyes on target on wall 3 feet away, tilt head down 15-30 and move head side to side for __45__ seconds. Repeat while moving head up and down for __45__ seconds.  Do __2__ sessions per day.

## 2021-05-03 NOTE — Therapy (Signed)
[facility] [address] [place], [place], [address] Phone: [phone]   Fax:  [phone]  Speech Language Pathology Treatment  Patient Details  Name: Tom Miranda MRN: [id number] Date of Birth: [birth year] Referring Provider (SLP): Ocie Doyne, MD   Encounter Date: 05/03/2021   End of Session - 05/03/21 0924     Visit Number 6    Number of Visits 17    Date for SLP Re-Evaluation 06/01/21    Authorization Type Medicaid Healthy [employer]    Authorization Time Period requested 15 visits on 04-06-21    Authorization - Visit Number 5    Authorization - Number of Visits 15    SLP Start Time 0930    SLP Stop Time  1016    SLP Time Calculation (min) 46 min    Activity Tolerance Patient tolerated treatment well             Past Medical History:  Diagnosis Date   Asthma    GERD (gastroesophageal reflux disease)    Hyperlipidemia     Past Surgical History:  Procedure Laterality Date   No prior surgery      There were no vitals filed for this visit.   Subjective Assessment - 05/03/21 0926     Subjective "I'm researching triple numbers to see the significance"    Currently in Pain? No/denies                   ADULT SLP TREATMENT - 05/03/21 0924       General Information   Behavior/Cognition Alert;Cooperative;Pleasant mood      Treatment Provided   Treatment provided Cognitive-Linquistic      Cognitive-Linquistic Treatment   Treatment focused on Cognition;Aphasia    Skilled Treatment Pt arrived on time. Pt appeared distracted today as pt was researching triple numbers. SLP requested redirection of attention to therapeutic tasks. Pt returned with HEP and targeted tasks (return keys/battery) completed. SLP targeted naming to generate sentences with two targeted words, in which pt benefited from occasional extra processing time and occasional SLP cues to simplify the verbalization to reduce  excessive pausing and complex word finding that impeded overall message. SLP re-educated word finding strategies and memory strategies (repetition, write it down, associate it) as pt was upset with himself he didn't recall specific term x1.      Assessment / Recommendations / Plan   Plan Continue with current plan of care      Progression Toward Goals   Progression toward goals Progressing toward goals              SLP Education - 05/03/21 1138     Education Details memory strategies, simplify for naming at this time    Person(s) Educated Patient    Methods Explanation;Demonstration;Handout;Verbal cues    Comprehension Verbalized understanding;Returned demonstration;Need further instruction              SLP Short Term Goals - 05/03/21 0925       SLP SHORT TERM GOAL #1   Title Pt will complete CLQT and cognitive communication PROM in first ST session    Baseline 16 - Cognition Function Short Form    Status Achieved      SLP SHORT TERM GOAL #2   Title Pt will implement attention and memory compensations to aid daily functioning given occasional min A over 2 sessions    Baseline Not effectively compensating for deficits      04/23/21; 05-01-21  Status Achieved      SLP SHORT TERM GOAL #3   Title Pt will implement memory compensations to aid appointment management to attend 3/3 therapy sessions on time    Baseline Pt missed 2 ST evals; 05-03-21    Time 2    Period Weeks    Status On-going      SLP SHORT TERM GOAL #4   Title Pt will use anomia compensations to aid word finding in 5 minute conversations given occasional min A over 2 sessions    Baseline word finding diffculty; 05-03-21    Time 2    Period Weeks    Status On-going      SLP SHORT TERM GOAL #5   Title Pt will utilize comprehension techniques to aid auditory comprehension during 5-10 minute video/presentation given occasional min A over 2 sessions    Baseline difficulty comprehending conversations    Time  2    Period Weeks    Status On-going              SLP Long Term Goals - 05/03/21 0926       SLP LONG TERM GOAL #1   Title Pt will implement attention and memory compensations to aid daily functioning given rare min A over 2 sessions    Baseline Not compensating for deficits    Time 6    Period Weeks    Status On-going      SLP LONG TERM GOAL #2   Title Pt will effectively manage appointments with no missed appointments reported over 2 weeks    Baseline Missed appointments    Time 6    Period Weeks    Status On-going      SLP LONG TERM GOAL #3   Title Pt will present work related presentation with use of anomia compensations given rare min A over 2 sessions    Baseline difficulty speaking during work presentations    Time 6    Period Weeks    Status On-going      SLP LONG TERM GOAL #4   Title Pt will write 2 short stories with cohesive storyline and rare spelling errors given rare min A    Baseline difficulty writing reported    Time 6    Period Weeks    Status On-going              Plan - 05/03/21 0925     Clinical Impression Statement Link Snufferddie has carried over compensatory strategies to aid recall and attention with success. Moderate cognitive impairments and mild word finding difficulty persist. Targeted compensations for generative naming to simplify to aid fluidity of naming as pt is experiencing word finding mostly related to complex or technical verbage. Continue skilled ST to maximize cognition and communication for success at work and return to [employer]    Speech Therapy Frequency 2x / week    Duration 8 weeks    Treatment/Interventions SLP instruction and feedback;Internal/external aids;Compensatory techniques;Language facilitation;Cognitive reorganization;Multimodal communcation approach;Functional tasks;Compensatory strategies;Patient/family education;Cueing hierarchy    Potential to Achieve Goals Good             Patient will benefit from skilled  therapeutic intervention in order to improve the following deficits and impairments:   Cognitive communication deficit  Aphasia    Problem List Patient Active Problem List   Diagnosis Date Noted   Concussion with no loss of consciousness 10/20/2020   Post-concussion headache 10/20/2020   Prediabetes 02/16/2020   Hyperlipidemia 02/16/2020   GERD (  gastroesophageal reflux disease)    Obesity (BMI 30.0-34.9) 06/02/2019   Glaucoma 06/02/2019    Janann Colonel, CCC-SLP 05/03/2021, 11:41 AM  [facility] [address] [place], [place], [address] Phone: [phone]   Fax:  [phone]   Name: KOBIE WHIDBY MRN: [id number] Date of Birth: [birth year]

## 2021-05-07 ENCOUNTER — Other Ambulatory Visit: Payer: Self-pay

## 2021-05-07 ENCOUNTER — Ambulatory Visit: Payer: Medicaid Other

## 2021-05-07 DIAGNOSIS — R4701 Aphasia: Secondary | ICD-10-CM

## 2021-05-07 DIAGNOSIS — M542 Cervicalgia: Secondary | ICD-10-CM | POA: Diagnosis not present

## 2021-05-07 DIAGNOSIS — R41841 Cognitive communication deficit: Secondary | ICD-10-CM

## 2021-05-07 NOTE — Therapy (Signed)
[facility] [address] [place], [place], [address] Phone: [phone]   Fax:  [phone]  Speech Language Pathology Treatment  Patient Details  Name: Tom Miranda MRN: [id number] Date of Birth: [birth year] Referring Provider (SLP): Genia Harold, MD   Encounter Date: 05/07/2021   End of Session - 05/07/21 1021     Visit Number 7    Number of Visits 17    Date for SLP Re-Evaluation 06/01/21    Authorization Type Medicaid Healthy Blue    Authorization Time Period requested 15 visits on 04-06-21    Authorization - Visit Number 6    Authorization - Number of Visits 15    SLP Start Time 1021   pt arrived late   SLP Stop Time  1100    SLP Time Calculation (min) 39 min    Activity Tolerance Patient tolerated treatment well             Past Medical History:  Diagnosis Date   Asthma    GERD (gastroesophageal reflux disease)    Hyperlipidemia     Past Surgical History:  Procedure Laterality Date   No prior surgery      There were no vitals filed for this visit.   Subjective Assessment - 05/07/21 1021     Subjective "I'm tired"    Currently in Pain? No/denies                   ADULT SLP TREATMENT - 05/07/21 1023       General Information   Behavior/Cognition Alert;Cooperative;Pleasant mood;Impulsive      Treatment Provided   Treatment provided Cognitive-Linquistic      Cognitive-Linquistic Treatment   Treatment focused on Cognition;Aphasia    Skilled Treatment Pt entered and appeared frustrated. Pt reportedly misplaced assigned ST task, in which pt able to recall task but unable to locate written notes on TedTal[employer]. Pt able to provided some details about 1/3 TedTalks watched. SLP generated cognitive strategies with functional application for every day life to reduce frustration. With mild prompting, pt able to identify internal and external distractions that appear to be hindering  cognitive performance. SLP generated strategies to aid attention and recall, including use of daily to-do lists, writing down thoughts to aid prioritization of tasks/reduction of extraneous information, and using one location to keep all necessary items. Pt verbalized understanding and agreement with implementation of recommended SLP strategies. Of note, pt presented with improved word finding with increased usage of complex verbage without halting/pausing.      Assessment / Recommendations / Plan   Plan Continue with current plan of care      Progression Toward Goals   Progression toward goals Progressing toward goals              SLP Education - 05/07/21 1134     Education Details memory and attention compensations and techniques    Person(s) Educated Patient    Methods Explanation;Demonstration;Verbal cues;Handout    Comprehension Verbalized understanding;Returned demonstration;Need further instruction              SLP Short Term Goals - 05/07/21 1135       SLP SHORT TERM GOAL #1   Title Pt will complete CLQT and cognitive communication PROM in first ST session    Baseline 16 - Cognition Function Short Form    Status Achieved      SLP SHORT TERM GOAL #2   Title Pt will implement attention and memory compensations  to aid daily functioning given occasional min A over 2 sessions    Baseline Not effectively compensating for deficits      04/23/21; 05-01-21    Status Achieved      SLP SHORT TERM GOAL #3   Title Pt will implement memory compensations to aid appointment management to attend 3/3 therapy sessions on time    Baseline Pt missed 2 ST evals; 05-03-21    Time 1    Period Weeks    Status On-going      SLP SHORT TERM GOAL #4   Title Pt will use anomia compensations to aid word finding in 5 minute conversations given occasional min A over 2 sessions    Baseline word finding diffculty; 05-03-21    Time 1    Period Weeks    Status On-going      SLP SHORT TERM GOAL #5    Title Pt will utilize comprehension techniques to aid auditory comprehension during 5-10 minute video/presentation given occasional min A over 2 sessions    Baseline difficulty comprehending conversations; 05-07-21    Time 1    Period Weeks    Status On-going              SLP Long Term Goals - 05/07/21 1136       SLP LONG TERM GOAL #1   Title Pt will implement attention and memory compensations to aid daily functioning given rare min A over 2 sessions    Baseline Not compensating for deficits    Time 5    Period Weeks    Status On-going      SLP LONG TERM GOAL #2   Title Pt will effectively manage appointments with no missed appointments reported over 2 weeks    Baseline Missed appointments    Time 5    Period Weeks    Status On-going      SLP LONG TERM GOAL #3   Title Pt will present work related presentation with use of anomia compensations given rare min A over 2 sessions    Baseline difficulty speaking during work presentations    Time 5    Period Weeks    Status On-going      SLP LONG TERM GOAL #4   Title Pt will write 2 short stories with cohesive storyline and rare spelling errors given rare min A    Baseline difficulty writing reported    Time 5    Period Weeks    Status On-going              Plan - 05/07/21 1134     Clinical Impression Statement Jshawn has carried over some compensatory strategies to aid recall and attention with success. Moderate cognitive impairments and mild word finding difficulty persist. Further education and training completed to aid attention and recall of functional tasks at home to improve thought organization. Continue skilled ST to maximize cognition and communication for success at work and return to [employer]    Speech Therapy Frequency 2x / week    Duration 8 weeks    Treatment/Interventions SLP instruction and feedback;Internal/external aids;Compensatory techniques;Language facilitation;Cognitive reorganization;Multimodal  communcation approach;Functional tasks;Compensatory strategies;Patient/family education;Cueing hierarchy    Potential to Achieve Goals Good             Patient will benefit from skilled therapeutic intervention in order to improve the following deficits and impairments:   Cognitive communication deficit  Aphasia    Problem List Patient Active Problem List   Diagnosis Date  Noted   Concussion with no loss of consciousness 10/20/2020   Post-concussion headache 10/20/2020   Prediabetes 02/16/2020   Hyperlipidemia 02/16/2020   GERD (gastroesophageal reflux disease)    Obesity (BMI 30.0-34.9) 06/02/2019   Glaucoma 06/02/2019    Alinda Deem, CCC-SLP 05/07/2021, 11:37 AM  [facility] [address] [place], [place], [address] Phone: [phone]   Fax:  [phone]   Name: JAEVEN PAMPHILE MRN: [id number] Date of Birth: 1975-08-21

## 2021-05-07 NOTE — Patient Instructions (Addendum)
Complete daily to-do lists   Get 3-ring binder to help you organize your therapy materials   Complete a "brain dump" - write down all the things you inside your brain   Consider "internal" and "external" distractions - opt to work in quiet, peaceful environments    Other things to consider:   1) De-clutter and organize one area of the house- 30 minutes a day as able    2) Think about your actions and reactions    To do list -Daughter - doctor appointment? 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

## 2021-05-08 ENCOUNTER — Telehealth: Payer: Self-pay

## 2021-05-08 NOTE — Telephone Encounter (Signed)
Patient calls nurse line regarding green stools for the last week. Reports that stool is formed and is not having diarrhea. Reports slight lower abdominal pressure and pressure in rectum when having bowel movement. Patient is having one BM per day.   Denies blood in the stool. Denies nausea and vomiting. No new changes in diet.   Scheduled patient appointment for further evaluation tomorrow morning with Dr. Thompson Grayer.   Talbot Grumbling, RN

## 2021-05-08 NOTE — Progress Notes (Deleted)
° ° °  SUBJECTIVE:   CHIEF COMPLAINT / HPI:   Green stools- for the past week.  HCM COVID, Tdap, flu vaccine Hep C testing  Prediabetes - A1c, microalbumin, foot exam due  PERTINENT  PMH / PSH: GERD, HLD, prediabetes  OBJECTIVE:   There were no vitals taken for this visit.  General: A&O, NAD HEENT: No sign of trauma, EOM grossly intact Cardiac: RRR, no m/r/g Respiratory: CTAB, normal WOB, no w/c/r GI: Soft, NTTP, non-distended  Extremities: NTTP, no peripheral edema. Neuro: Normal gait, moves all four extremities appropriately. Psych: Appropriate mood and affect   ASSESSMENT/PLAN:   No problem-specific Assessment & Plan notes found for this encounter.     Billey Co, MD [facility]

## 2021-05-09 ENCOUNTER — Ambulatory Visit: Payer: Medicaid Other | Admitting: Family Medicine

## 2021-05-10 ENCOUNTER — Ambulatory Visit: Payer: Medicaid Other

## 2021-05-10 ENCOUNTER — Other Ambulatory Visit: Payer: Self-pay

## 2021-05-10 ENCOUNTER — Ambulatory Visit: Payer: Medicaid Other | Admitting: Physical Therapy

## 2021-05-10 DIAGNOSIS — G44321 Chronic post-traumatic headache, intractable: Secondary | ICD-10-CM

## 2021-05-10 DIAGNOSIS — R41841 Cognitive communication deficit: Secondary | ICD-10-CM

## 2021-05-10 DIAGNOSIS — M542 Cervicalgia: Secondary | ICD-10-CM

## 2021-05-10 DIAGNOSIS — R4701 Aphasia: Secondary | ICD-10-CM

## 2021-05-10 DIAGNOSIS — H5213 Myopia, bilateral: Secondary | ICD-10-CM | POA: Diagnosis not present

## 2021-05-10 NOTE — Patient Instructions (Addendum)
Access Code: [facility] URL: [email or web address] Date: 04/26/2021 Prepared by: Bufford Lope  Increased smooth pursuits to 10 reps each side to side, up and down  Gaze Stabilization: Sitting    Keeping eyes on target on wall 3 feet away, tilt head down 15-30 and move head side to side for _60_ seconds. Repeat while moving head up and down for __45__ seconds; if you can tolerate it, try to increase the time for up and down to 60 seconds.  Do __2__ sessions per day.  Access Code: [facility] URL: [email or web address] Date: 05/10/2021 Prepared by: Bufford Lope  Exercises Supine Suboccipital Release with Tennis Balls - 1 x daily - 7 x weekly - 2 minute hold Seated Assisted Cervical Rotation with Towel - 1 x daily - 7 x weekly - 5 reps - 5-6 second hold Seated Cervical Sidebending Stretch - 1 x daily - 7 x weekly - 2 sets - 30 second hold Seated Shoulder Rolls - 1 x daily - 7 x weekly - 2 sets - 10 reps Seated Proximal-Distal Smooth Pursuit - 1 x daily - 7 x weekly - 1 sets - 7 reps Seated Head Nod Vestibular Habituation - 1 x daily - 7 x weekly - 1 sets - 10 reps Seated Reaching Down to Floor - 1 x daily - 7 x weekly - 5 reps STANDING FEET WIDE - EYES MOVE SIDE TO SIDE; UP AND DOWN - 1 x daily - 7 x weekly - 2 sets - 10 reps

## 2021-05-10 NOTE — Therapy (Signed)
[facility] [address] [place], [place], [address] Phone: [phone]   Fax:  [phone]  Physical Therapy Treatment  Patient Details  Name: Tom Miranda MRN: [id number] Date of Birth: [birth year] Referring Provider (PT): Genia Harold, MD   Encounter Date: 05/10/2021   PT End of Session - 05/10/21 2002     Visit Number 7    Number of Visits 12    Date for PT Re-Evaluation 06/17/21    Authorization Type Medicaid Healthy Blue - have requested 6 more visits    PT Start Time 1020    PT Stop Time 1058    PT Time Calculation (min) 38 min    Activity Tolerance Patient tolerated treatment well    Behavior During Therapy WFL for tasks assessed/performed             Past Medical History:  Diagnosis Date   Asthma    GERD (gastroesophageal reflux disease)    Hyperlipidemia     Past Surgical History:  Procedure Laterality Date   No prior surgery      There were no vitals filed for this visit.   Subjective Assessment - 05/10/21 1021     Subjective Still waiting for glasses to come in.  Still having a hard time with looking at computer screen.  No symptoms today.  Has not slept well, daughter has been sick.    Pertinent History asthma, GERD, HLD, glaucoma, prediabetes    Limitations Other (comment)   sleeping; work   Diagnostic tests MRI today; Ophthalmology moved to January the 4th                Vestibular Treatment/Exercise - 05/10/21 1027       Vestibular Treatment/Exercise   Vestibular Treatment Provided Gaze;Habituation    Habituation Exercises Seated Diagonal Head Turns    Gaze Exercises X1 Viewing Horizontal;X1 Viewing Vertical      Seated Horizontal Head Turns   Number of Reps  10    Symptom Description  with back supported and feet supported; EC, no symptoms      Seated Vertical Head Turns   Number of Reps  10    Symptom Description  with back supported and feet supported, EC - pt  reports feeling like he is going to fall forwards.  Performed second set with UE fully support, feet fully supported and back supported to provide pt with increased stability and proprioceptive awareness      Seated Diagonal Head Turns   Number of Reps 5    Symptoms Description  2 sets x 5 reps, alternating reaching across midline and down to opposite foot.  Performed with EO, mild symptoms.  Performed with EC with increased dizziness and HA.      X1 Viewing Horizontal   Foot Position seated with back support    Reps 2    Comments increased time to 45 seconds; mild pain turning to R but no dizziness or HA.  Increased to 60 seconds; no dizziness or HA      X1 Viewing Vertical   Foot Position seated with back support    Reps 2    Comments increased to 45 seconds; slight dizziness.  Increased time to 60 seconds; dizziness and slight HA               PT Education - 05/10/21 2002     Education Details updated HEP    Person(s) Educated Patient    Methods Explanation;Demonstration;Handout  Comprehension Verbalized understanding;Returned demonstration                 PT Long Term Goals - 05/03/21 1126       PT LONG TERM GOAL #1   Title Pt will demonstrate independence with final neck and visual/vestibular HEP    Baseline updated HEP today    Time 6    Period Weeks    Status Revised    Target Date 06/17/21      PT LONG TERM GOAL #2   Title Pt will demonstrate increase in pain free neck rotation to 60 deg bilaterally to improve safety with driving    Baseline partially met; rotation has increased to 50 deg bilaterally    Time 6    Period Weeks    Status Revised    Target Date 06/17/21      PT LONG TERM GOAL #3   Title Pt will report a consistent decrease in nightly HA pain to 2/10 overall to improve sleep    Baseline still getting HA at night 8/10; last night was first night without HA    Time 6    Period Weeks    Status Revised    Target Date 06/17/21       PT LONG TERM GOAL #4   Title Pt will report ability to perform work on computer and phone for 4-6 hours without increase in eye fatigue/headache    Baseline only able to look at computer/phone for 10-15 minutes at a time before eye fatigue/HA begin    Time 6    Period Weeks    Status Revised    Target Date 06/17/21                   Plan - 05/10/21 2002     Clinical Impression Statement Pt continues to demonstrate progress with visual/vestibular training.  Progressed time to perform x1 viewing but continues to perform in sitting.  Also focused on progression of habituation exercises adding in bending forwards to reach to the ground and head movements with vision removed.  Pt continues to report vertical head and body movements as most provocative.  Will continue to address and progress towards LTG.    Personal Factors and Comorbidities Comorbidity 2;Profession    Comorbidities asthma, GERD, HLD, glaucoma, prediabetes    Examination-Activity Limitations Lift;Sleep;Reach Overhead    Examination-Participation Restrictions Community Activity;Occupation    Stability/Clinical Decision Making Stable/Uncomplicated    Rehab Potential Good    PT Frequency 1x / week    PT Duration 6 weeks    PT Treatment/Interventions ADLs/Self Care Home Management;Cryotherapy;Moist Heat;Functional mobility training;Therapeutic activities;Therapeutic exercise;Balance training;Neuromuscular re-education;Manual techniques;Dry needling;Patient/family education;Passive range of motion;Taping;Visual/perceptual remediation/compensation;Vestibular    PT Next Visit Plan x1 viewing progress to standing.  Head movements with eyes closed sitting > standing.  Walking with head turns.    Consulted and Agree with Plan of Care Patient             Patient will benefit from skilled therapeutic intervention in order to improve the following deficits and impairments:  Decreased activity tolerance, Decreased range of  motion, Impaired vision/preception, Pain  Visit Diagnosis: Cervicalgia  Intractable chronic post-traumatic headache     Problem List Patient Active Problem List   Diagnosis Date Noted   Concussion with no loss of consciousness 10/20/2020   Post-concussion headache 10/20/2020   Prediabetes 02/16/2020   Hyperlipidemia 02/16/2020   GERD (gastroesophageal reflux disease)    Obesity (BMI 30.0-34.9) 06/02/2019  Glaucoma 06/02/2019   Tom Miranda, PT, DPT 05/10/21    8:07 PM    [facility] [address] [place], [place], [address] Phone: [phone]   Fax:  [phone]  Name: Tom Miranda MRN: [id number] Date of Birth: 06-21-1975

## 2021-05-10 NOTE — Therapy (Signed)
[facility] [address] [place], [place], [address] Phone: [phone]   Fax:  [phone]  Speech Language Pathology Treatment  Patient Details  Name: Tom Miranda MRN: [id number] Date of Birth: September 25, 1975 Referring Provider (SLP): Ocie Doynehima, Jennifer, MD   Encounter Date: 05/10/2021   End of Session - 05/10/21 1057     Visit Number 8    Number of Visits 17    Date for SLP Re-Evaluation 06/01/21    Authorization Type Medicaid [employer]    Authorization Time Period requested 15 visits on 04-06-21    Authorization - Visit Number 7    Authorization - Number of Visits 15    SLP Start Time 1102    SLP Stop Time  1145    SLP Time Calculation (min) 43 min    Activity Tolerance Patient tolerated treatment well             Past Medical History:  Diagnosis Date   Asthma    GERD (gastroesophageal reflux disease)    Hyperlipidemia     Past Surgical History:  Procedure Laterality Date   No prior surgery      There were no vitals filed for this visit.   Subjective Assessment - 05/10/21 1102     Subjective "just a little tired"    Currently in Pain? No/denies                   ADULT SLP TREATMENT - 05/10/21 1054       General Information   Behavior/Cognition Alert;Cooperative;Pleasant mood      Treatment Provided   Treatment provided Cognitive-Linquistic      Cognitive-Linquistic Treatment   Treatment focused on Cognition;Aphasia    Skilled Treatment Pt has implemented external memory/attention aids, including to-do lists and brain dump to organize thoughts, which has been every effective. Pt has been able to prioritize orders with improved efficiency. Pt is using internal repetition and rephrasing to aid attention and comprehension. Improved word finding endorsed. Pt exhibited 3 episodes of anomia in short work presentation, in which pt able to identify targeted word with extra processing time.  SLP provided HEP to addressing writing, spellling, and naming. SLP recommended strategies to aid spelling and recall.      Assessment / Recommendations / Plan   Plan Continue with current plan of care      Progression Toward Goals   Progression toward goals Progressing toward goals              SLP Education - 05/10/21 1152     Education Details comprehension strategies, spelling compensations    Person(s) Educated Patient    Methods Explanation;Demonstration;Handout;Verbal cues    Comprehension Verbalized understanding;Returned demonstration;Need further instruction              SLP Short Term Goals - 05/10/21 1105       SLP SHORT TERM GOAL #1   Title Pt will complete CLQT and cognitive communication PROM in first ST session    Baseline 16 - Cognition Function Short Form    Status Achieved      SLP SHORT TERM GOAL #2   Title Pt will implement attention and memory compensations to aid daily functioning given occasional min A over 2 sessions    Baseline Not effectively compensating for deficits      04/23/21; 05-01-21    Status Achieved      SLP SHORT TERM GOAL #3   Title Pt will implement  memory compensations to aid appointment management to attend 3/3 therapy sessions on time    Baseline Pt missed 2 ST evals; 05-03-21, 05-10-21    Time 1    Period Weeks    Status On-going      SLP SHORT TERM GOAL #4   Title Pt will use anomia compensations to aid word finding in 5 minute conversations given occasional min A over 2 sessions    Baseline word finding diffculty; 05-03-21, 05-10-21    Time 1    Period Weeks    Status Achieved      SLP SHORT TERM GOAL #5   Title Pt will utilize comprehension techniques to aid auditory comprehension during 5-10 minute video/presentation given occasional min A over 2 sessions    Baseline difficulty comprehending conversations; 05-07-21, 05-10-21    Time 1    Period Weeks    Status Achieved              SLP Long Term Goals - 05/10/21  1058       SLP LONG TERM GOAL #1   Title Pt will implement attention and memory compensations to aid daily functioning given rare min A over 2 sessions    Baseline Not compensating for deficits; 05-10-21    Time 5    Period Weeks    Status On-going      SLP LONG TERM GOAL #2   Title Pt will effectively manage appointments with no missed appointments reported over 2 weeks    Baseline Missed appointments    Time 5    Period Weeks    Status On-going      SLP LONG TERM GOAL #3   Title Pt will present work related presentation with use of anomia compensations given rare min A over 2 sessions    Baseline difficulty speaking during work presentations    Time 5    Period Weeks    Status On-going      SLP LONG TERM GOAL #4   Title Pt will write 2 short stories with cohesive storyline and rare spelling errors given rare min A    Baseline difficulty writing reported    Time 5    Period Weeks    Status On-going              Plan - 05/10/21 1058     Clinical Impression Statement Jebediah has carried over internal/external compensatory strategies to aid recall, attention, and comprehension with success. Improvements in moderate cognitive impairments and mild word finding difficulty exhibited. Further education and training completed to aid writing and spelling at home to improve thought organization. Continue skilled ST to maximize cognition and communication for success at work and return to [employer]    Speech Therapy Frequency 2x / week    Duration 8 weeks    Treatment/Interventions SLP instruction and feedback;Internal/external aids;Compensatory techniques;Language facilitation;Cognitive reorganization;Multimodal communcation approach;Functional tasks;Compensatory strategies;Patient/family education;Cueing hierarchy    Potential to Achieve Goals Good             Patient will benefit from skilled therapeutic intervention in order to improve the following deficits and impairments:    Cognitive communication deficit  Aphasia    Problem List Patient Active Problem List   Diagnosis Date Noted   Concussion with no loss of consciousness 10/20/2020   Post-concussion headache 10/20/2020   Prediabetes 02/16/2020   Hyperlipidemia 02/16/2020   GERD (gastroesophageal reflux disease)    Obesity (BMI 30.0-34.9) 06/02/2019   Glaucoma 06/02/2019    Natalia Leatherwood  Rosalio Loud, CCC-SLP 05/10/2021, 11:58 AM  [facility] [address] [place], [place], [address] Phone: [phone]   Fax:  [phone]   Name: CARMAN ESSICK MRN: [id number] Date of Birth: Apr 25, 1975

## 2021-05-13 NOTE — Progress Notes (Deleted)
° ° ° °  SUBJECTIVE:   CHIEF COMPLAINT / HPI:   Tom Miranda is a 46 y.o. male presents for green stools  Green stools Pt reports abnormal stools for *** days.  Fevers, nausea,vomiting, diarrhea, abdominal pain, rectal bleeding or melena    ***  Flowsheet Row Office Visit from 10/19/2020 in [facility]  PHQ-9 Total Score 6        Health Maintenance Due  Topic   COVID-19 Vaccine (1)   FOOT EXAM    OPHTHALMOLOGY EXAM    URINE MICROALBUMIN    Hepatitis C Screening    TETANUS/TDAP    INFLUENZA VACCINE    HEMOGLOBIN A1C       PERTINENT  PMH / PSH:   OBJECTIVE:   There were no vitals taken for this visit.   General: Alert, no acute distress Cardio: Normal S1 and S2, RRR, no r/m/g Pulm: CTAB, normal work of breathing Abdomen: Bowel sounds normal. Abdomen soft and non-tender.  Extremities: No peripheral edema.  Neuro: Cranial nerves grossly intact   ASSESSMENT/PLAN:   No problem-specific Assessment & Plan notes found for this encounter.    Towanda Octave, MD PGY-3 [facility]

## 2021-05-14 ENCOUNTER — Other Ambulatory Visit: Payer: Self-pay

## 2021-05-14 ENCOUNTER — Ambulatory Visit: Payer: Medicaid Other

## 2021-05-14 ENCOUNTER — Ambulatory Visit (INDEPENDENT_AMBULATORY_CARE_PROVIDER_SITE_OTHER): Payer: Medicaid Other | Admitting: Family Medicine

## 2021-05-14 DIAGNOSIS — R4701 Aphasia: Secondary | ICD-10-CM

## 2021-05-14 DIAGNOSIS — M542 Cervicalgia: Secondary | ICD-10-CM | POA: Diagnosis not present

## 2021-05-14 DIAGNOSIS — Z91199 Patient's noncompliance with other medical treatment and regimen due to unspecified reason: Secondary | ICD-10-CM

## 2021-05-14 DIAGNOSIS — R41841 Cognitive communication deficit: Secondary | ICD-10-CM

## 2021-05-14 NOTE — Progress Notes (Signed)
No show

## 2021-05-14 NOTE — Therapy (Signed)
[facility] [address] [place], [place], [address] Phone: [phone]   Fax:  [phone]  Speech Language Pathology Treatment  Patient Details  Name: Tom Miranda MRN: [id number] Date of Birth: [birth year] Referring Provider (SLP): Ocie Doyne, MD   Encounter Date: 05/14/2021   End of Session - 05/14/21 1017     Visit Number 9    Number of Visits 17    Date for SLP Re-Evaluation 06/01/21    Authorization Type Medicaid [employer]    Authorization Time Period requested 15 visits on 04-06-21    Authorization - Visit Number 8    Authorization - Number of Visits 15    SLP Start Time 1017    SLP Stop Time  1100    SLP Time Calculation (min) 43 min    Activity Tolerance Patient tolerated treatment well             Past Medical History:  Diagnosis Date   Asthma    GERD (gastroesophageal reflux disease)    Hyperlipidemia     Past Surgical History:  Procedure Laterality Date   No prior surgery      There were no vitals filed for this visit.   Subjective Assessment - 05/14/21 1018     Subjective "nothing new"    Currently in Pain? No/denies                   ADULT SLP TREATMENT - 05/14/21 1016       General Information   Behavior/Cognition Alert;Cooperative;Pleasant mood;Requires cueing      Treatment Provided   Treatment provided Cognitive-Linquistic      Cognitive-Linquistic Treatment   Treatment focused on Cognition;Aphasia    Skilled Treatment "It wasn't as easy as I thought it was" re: HEP to generate short stories and fill in blanks for convergent categories. Some difficulty spelling indicated, especially "i/e." SLP identified additional spelling errors, which pt missed despite double checking indicating reduced awareness. SLP generated strategy for patient to rewrite targeted words seperately and try different letter combinations, which was effective. Pt completed 3 short  stories, in which pt missed words x4. Occasional cues required to ID missed words and spelling errors. Intermittent frustration exhibited, in which SLP provided redirection and positive reinforcement, which was effective.      Assessment / Recommendations / Plan   Plan Continue with current plan of care      Progression Toward Goals   Progression toward goals Progressing toward goals              SLP Education - 05/14/21 1104     Education Details HEP, techniques to aid awareness for spelling and writing    Person(s) Educated Patient    Methods Explanation;Demonstration;Handout    Comprehension Verbalized understanding;Returned demonstration;Need further instruction              SLP Short Term Goals - 05/14/21 1105       SLP SHORT TERM GOAL #1   Title Pt will complete CLQT and cognitive communication PROM in first ST session    Baseline 16 - Cognition Function Short Form    Status Achieved      SLP SHORT TERM GOAL #2   Title Pt will implement attention and memory compensations to aid daily functioning given occasional min A over 2 sessions    Baseline Not effectively compensating for deficits      04/23/21; 05-01-21    Status Achieved  SLP SHORT TERM GOAL #3   Title Pt will implement memory compensations to aid appointment management to attend 3/3 therapy sessions on time    Baseline Pt missed 2 ST evals; 05-03-21, 05-10-21, 05-14-21    Status Achieved      SLP SHORT TERM GOAL #4   Title Pt will use anomia compensations to aid word finding in 5 minute conversations given occasional min A over 2 sessions    Baseline word finding diffculty; 05-03-21, 05-10-21    Status Achieved      SLP SHORT TERM GOAL #5   Title Pt will utilize comprehension techniques to aid auditory comprehension during 5-10 minute video/presentation given occasional min A over 2 sessions    Baseline difficulty comprehending conversations; 05-07-21, 05-10-21    Status Achieved              SLP  Long Term Goals - 05/14/21 1105       SLP LONG TERM GOAL #1   Title Pt will implement attention and memory compensations to aid daily functioning given rare min A over 2 sessions    Baseline Not compensating for deficits; 05-10-21    Time 4    Period Weeks    Status On-going      SLP LONG TERM GOAL #2   Title Pt will effectively manage appointments with no missed appointments reported over 2 weeks    Baseline Missed appointments    Time 4    Period Weeks    Status On-going      SLP LONG TERM GOAL #3   Title Pt will present work related presentation with use of anomia compensations given rare min A over 2 sessions    Baseline difficulty speaking during work presentations    Time 4    Period Weeks    Status On-going      SLP LONG TERM GOAL #4   Title Pt will write 2 short stories with cohesive storyline and rare spelling errors given rare min A    Baseline difficulty writing reported    Time 4    Period Weeks    Status On-going              Plan - 05/14/21 1104     Clinical Impression Statement Tom Miranda has carried over internal/external compensatory strategies to aid recall, attention, and comprehension with success. Improvements in cognitive impairments and mild word finding difficulty exhibited. SLP conducted further education and training completed for techniques to aid writing and spelling to aid thought organization and word finding for more complex tasks. Continue skilled ST to maximize cognition and communication for success at work and return to Tom Miranda    Speech Therapy Frequency 2x / week    Duration 8 weeks    Treatment/Interventions SLP instruction and feedback;Internal/external aids;Compensatory techniques;Language facilitation;Cognitive reorganization;Multimodal communcation approach;Functional tasks;Compensatory strategies;Patient/family education;Cueing hierarchy    Potential to Achieve Goals Good             Patient will benefit from skilled therapeutic  intervention in order to improve the following deficits and impairments:   Aphasia  Cognitive communication deficit    Problem List Patient Active Problem List   Diagnosis Date Noted   Concussion with no loss of consciousness 10/20/2020   Post-concussion headache 10/20/2020   Prediabetes 02/16/2020   Hyperlipidemia 02/16/2020   GERD (gastroesophageal reflux disease)    Obesity (BMI 30.0-34.9) 06/02/2019   Glaucoma 06/02/2019    Tom Miranda, CCC-SLP 05/14/2021, 11:10 AM  [facility] [address] [place], [place], [address] Phone: [phone]   Fax:  [phone]   Name: DONTEZ HAUSS MRN: [id number] Date of Birth: 21-Apr-1975

## 2021-05-14 NOTE — Assessment & Plan Note (Signed)
No show 05/14/21.

## 2021-05-17 ENCOUNTER — Ambulatory Visit: Payer: Medicaid Other | Admitting: Physical Therapy

## 2021-05-17 ENCOUNTER — Ambulatory Visit: Payer: Medicaid Other | Attending: Psychiatry | Admitting: Speech Pathology

## 2021-05-17 DIAGNOSIS — R41841 Cognitive communication deficit: Secondary | ICD-10-CM | POA: Insufficient documentation

## 2021-05-17 DIAGNOSIS — R4701 Aphasia: Secondary | ICD-10-CM | POA: Insufficient documentation

## 2021-05-21 ENCOUNTER — Ambulatory Visit: Payer: Medicaid Other

## 2021-05-21 ENCOUNTER — Other Ambulatory Visit: Payer: Self-pay

## 2021-05-21 DIAGNOSIS — R4701 Aphasia: Secondary | ICD-10-CM | POA: Diagnosis not present

## 2021-05-21 DIAGNOSIS — R41841 Cognitive communication deficit: Secondary | ICD-10-CM | POA: Diagnosis not present

## 2021-05-21 NOTE — Therapy (Signed)
[facility] [address] [place], [place], [address] Phone: [phone]   Fax:  [phone]  Speech Language Pathology Treatment  Patient Details  Name: Tom Miranda MRN: [id number] Date of Birth: 02-Oct-1975 Referring Provider (SLP): Ocie Doyne, MD   Encounter Date: 05/21/2021   End of Session - 05/21/21 1026     Visit Number 10    Number of Visits 17    Date for SLP Re-Evaluation 06/01/21    Authorization Type Medicaid [employer]    Authorization Time Period requested 15 visits on 04-06-21    Authorization - Visit Number 9    Authorization - Number of Visits 15    SLP Start Time 1023   pt arrived late   SLP Stop Time  1103    SLP Time Calculation (min) 40 min    Activity Tolerance Patient tolerated treatment well             Past Medical History:  Diagnosis Date   Asthma    GERD (gastroesophageal reflux disease)    Hyperlipidemia     Past Surgical History:  Procedure Laterality Date   No prior surgery      There were no vitals filed for this visit.   Subjective Assessment - 05/21/21 1025     Subjective "nothing"    Currently in Pain? No/denies                   ADULT SLP TREATMENT - 05/21/21 1025       General Information   Behavior/Cognition Alert;Cooperative;Pleasant mood;Requires cueing   frustration     Treatment Provided   Treatment provided Cognitive-Linquistic      Cognitive-Linquistic Treatment   Treatment focused on Cognition;Aphasia    Skilled Treatment Pt entered frustrated secondary to son breaking sunglasses this morning. 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 In review, SLP identified missed word x1 and rare errors x2 (word substitutions and spelling errors) despite pt reported immediate double checking of work. SLP generated strategy for patient double checking immediately and after delay/break. SLP recommended reading silently and aloud  to aid attention and error awareness. SLP prompted pt to identify errors in secondary review, in which pt able to identify errors x2 and correct with mod I. SLP generated strategy to build upon framework of initial concept and modify as needed. Pt able to complete task with extra processing time. When cued to double check, pt independently identified errors x1. Increased difficulty reported for abstract naming, in which SLP educated patient on mental flexability and abstract concepts are generally more complex. Pt verbalized understanding and agreement.      Assessment / Recommendations / Plan   Plan Continue with current plan of care      Progression Toward Goals   Progression toward goals Progressing toward goals              SLP Education - 05/21/21 1054     Education Details strategies for error awareness    Person(s) Educated Patient    Methods Explanation;Demonstration;Handout    Comprehension Verbalized understanding;Returned demonstration;Need further instruction              SLP Short Term Goals - 05/14/21 1105       SLP SHORT TERM GOAL #1   Title Pt will complete CLQT and cognitive communication PROM in first ST session    Baseline 16 - Cognition Function Short Form    Status Achieved  SLP SHORT TERM GOAL #2   Title Pt will implement attention and memory compensations to aid daily functioning given occasional min A over 2 sessions    Baseline Not effectively compensating for deficits      04/23/21; 05-01-21    Status Achieved      SLP SHORT TERM GOAL #3   Title Pt will implement memory compensations to aid appointment management to attend 3/3 therapy sessions on time    Baseline Pt missed 2 ST evals; 05-03-21, 05-10-21, 05-14-21    Status Achieved      SLP SHORT TERM GOAL #4   Title Pt will use anomia compensations to aid word finding in 5 minute conversations given occasional min A over 2 sessions    Baseline word finding diffculty; 05-03-21, 05-10-21    Status  Achieved      SLP SHORT TERM GOAL #5   Title Pt will utilize comprehension techniques to aid auditory comprehension during 5-10 minute video/presentation given occasional min A over 2 sessions    Baseline difficulty comprehending conversations; 05-07-21, 05-10-21    Status Achieved              SLP Long Term Goals - 05/21/21 1026       SLP LONG TERM GOAL #1   Title Pt will implement attention and memory compensations to aid daily functioning given rare min A over 2 sessions    Baseline Not compensating for deficits; 05-10-21    Time 3    Period Weeks    Status On-going      SLP LONG TERM GOAL #2   Title Pt will effectively manage appointments with no missed appointments reported over 2 weeks    Baseline Missed appointments    Time 3    Period Weeks    Status On-going      SLP LONG TERM GOAL #3   Title Pt will present work related presentation with use of anomia compensations given rare min A over 2 sessions    Baseline difficulty speaking during work presentations; 05-21-21    Time 3    Period Weeks    Status On-going      SLP LONG TERM GOAL #4   Title Pt will write 2 short stories with cohesive storyline and rare spelling errors given rare min A    Baseline difficulty writing reported; 05-21-21    Time 3    Period Weeks    Status On-going              Plan - 05/21/21 1026     Clinical Impression Statement Tom Miranda has carried over internal/external compensatory strategies to aid recall, attention, and comprehension with success. Improvements in cognitive impairments and mild word finding difficulty exhibited. SLP conducted further education and training completed for techniques to aid attention and error awareness to aid thought organization and word finding for more complex tasks. Continue skilled ST to maximize cognition and communication for success at work and return to Tom Miranda    Speech Therapy Frequency 2x / week    Duration 8 weeks    Treatment/Interventions SLP  instruction and feedback;Internal/external aids;Compensatory techniques;Language facilitation;Cognitive reorganization;Multimodal communcation approach;Functional tasks;Compensatory strategies;Patient/family education;Cueing hierarchy    Potential to Achieve Goals Good             Patient will benefit from skilled therapeutic intervention in order to improve the following deficits and impairments:   Cognitive communication deficit  Aphasia    Problem List Patient Active Problem List   Diagnosis  Date Noted   No-show for appointment 05/14/2021   Concussion with no loss of consciousness 10/20/2020   Post-concussion headache 10/20/2020   Prediabetes 02/16/2020   Hyperlipidemia 02/16/2020   GERD (gastroesophageal reflux disease)    Obesity (BMI 30.0-34.9) 06/02/2019   Glaucoma 06/02/2019    Janann Colonel, CCC-SLP 05/21/2021, 11:08 AM  [facility] [address] [place], [place], [address] Phone: [phone]   Fax:  [phone]   Name: Tom Miranda MRN: [id number] Date of Birth: July 20, 1975

## 2021-05-21 NOTE — Patient Instructions (Signed)
For error awareness:  -Double check immediately AND again after short break  -Read both silently AND aloud to see if that helps you identify errors  -If you get frustrated or fatigued, take a break!

## 2021-05-22 ENCOUNTER — Telehealth: Payer: Self-pay | Admitting: Psychiatry

## 2021-05-22 NOTE — Telephone Encounter (Signed)
Pt states his last day of therapy was today and he is asking for a call re: what is next.

## 2021-05-22 NOTE — Telephone Encounter (Signed)
I called the pt back. Pt reports he is scheduled to stop therapy today and no further appt will be needed. Pt states gabapentin has not been helping with his headaches.  Per Dr. Quentin Mulling last note, she wanted to see him back within 3 months. F/u was not made. I have scheduled f/u for 05/24/21 at 11 am for the pt.

## 2021-05-24 ENCOUNTER — Ambulatory Visit (INDEPENDENT_AMBULATORY_CARE_PROVIDER_SITE_OTHER): Payer: Medicaid Other | Admitting: Psychiatry

## 2021-05-24 ENCOUNTER — Ambulatory Visit: Payer: Medicaid Other

## 2021-05-24 ENCOUNTER — Encounter: Payer: Self-pay | Admitting: Psychiatry

## 2021-05-24 ENCOUNTER — Other Ambulatory Visit: Payer: Self-pay

## 2021-05-24 VITALS — BP 125/64 | HR 70 | Ht 68.0 in | Wt 221.0 lb

## 2021-05-24 DIAGNOSIS — R519 Headache, unspecified: Secondary | ICD-10-CM

## 2021-05-24 DIAGNOSIS — R4701 Aphasia: Secondary | ICD-10-CM | POA: Diagnosis not present

## 2021-05-24 DIAGNOSIS — R41841 Cognitive communication deficit: Secondary | ICD-10-CM

## 2021-05-24 DIAGNOSIS — F0781 Postconcussional syndrome: Secondary | ICD-10-CM

## 2021-05-24 MED ORDER — RIZATRIPTAN BENZOATE 10 MG PO TABS: 10.0000 mg | ORAL_TABLET | ORAL | 3 refills | Status: DC | PRN

## 2021-05-24 MED ORDER — METHOCARBAMOL 500 MG PO TABS: 500.0000 mg | ORAL_TABLET | Freq: Every evening | ORAL | 3 refills | Status: DC | PRN

## 2021-05-24 NOTE — Progress Notes (Signed)
° °  CC:  post-concussion syndrome  Follow-up Visit  Last visit: 02/12/21  Brief HPI: 46 year old male with a history of glaucoma, HLD, prediabetes and GERD who follows in clinic for headaches and cognitive changes following a concussion in June 2022.  At his last visit MRI brain was ordered and he was referred to ophthalmology for decreased vision in his left eye. Gabapentin was started for prevention and he was referred to neck PT and cognitive rehab.  Interval History: Since his last visit he has improved, though he is continuing to have headaches every night. Has a lot of trouble falling asleep at night which he thinks is making his headaches worse. Gabapentin didn't help so he stopped taking it. He is taking Goody powder ~3 days per week.   Neck PT has helped reduce his neck pain. Cognitive rehab has been helpful, feels he's about 70% better.  Coronary artery CT without evidence of CAD.  Saw the ophthalmologist who noted worsening of glaucoma and started him on eye drops  MRI brain was normal (images reviewed personally 05/24/21)  Physical Exam:   Vital Signs: BP 125/64    Pulse 70    Ht 5\' 8"  (1.727 m)    Wt 221 lb (100.2 kg)    BMI 33.60 kg/m  GENERAL:  well appearing, in no acute distress, alert  SKIN:  Color, texture, turgor normal. No rashes or lesions HEAD:  Normocephalic/atraumatic. RESP: normal respiratory effort MSK:  No gross joint deformities.   NEUROLOGICAL: Mental Status: Alert, oriented to person, place and time, Follows commands, and Speech fluent and appropriate. Cranial Nerves: PERRL, face symmetric, no dysarthria, hearing grossly intact Motor: moves all extremities equally Gait: normal-based.  IMPRESSION: 46 year old male with a history of glaucoma, HLD, prediabetes, and GERD who presents for follow up of post-concussive syndrome. Cognition and neck pain have improved with therapy, but he continues to have insomnia and daily headaches. Discussed medication  options, and he would prefer to try a PRN muscle relaxer and triptan. If headaches do not improve with this regimen will plan to add daily preventive. Options are limited due to history of glaucoma. Could consider low dose propranolol for prevention.  PLAN: -Rescue: Start Maxalt 10 mg PRN, Robaxin 500 mg QHS PRN -next steps: consider propranolol for prevention  Follow-up: 3 months  I spent a total of 27 minutes on the date of the service. Discussed medication side effects, adverse reactions and drug interactions. Written educational materials and patient instructions outlining all of the above were given.  57, MD 05/24/21 11:24 AM

## 2021-05-24 NOTE — Therapy (Signed)
[facility] [address] [place], [place], [address] Phone: [phone]   Fax:  [phone]  Speech Language Pathology Treatment/Discharge Summary  Patient Details  Name: Tom Miranda MRN: [id number] Date of Birth: 1975-12-03 Referring Provider (SLP): Genia Harold, MD   Encounter Date: 05/24/2021   End of Session - 05/24/21 1028     Visit Number 11    Number of Visits 17    Date for SLP Re-Evaluation 06/01/21    Authorization Type Medicaid [employer]    Authorization Time Period requested 15 visits on 04-06-21    Authorization - Visit Number 10    Authorization - Number of Visits 15    SLP Start Time 0940    SLP Stop Time  1047    SLP Time Calculation (min) 32 min    Activity Tolerance Patient tolerated treatment well             Past Medical History:  Diagnosis Date   Asthma    GERD (gastroesophageal reflux disease)    Hyperlipidemia     Past Surgical History:  Procedure Laterality Date   No prior surgery      There were no vitals filed for this visit.   Subjective Assessment - 05/24/21 1017     Subjective "I have an appointment at 11"    Currently in Pain? No/denies            SPEECH THERAPY DISCHARGE SUMMARY  Visits from Start of Care: 11  Current functional level related to goals / functional outcomes: Tom Miranda presents with improvements in word finding and cognitive linguistic skills given ST intervention and training. Rare anomia and less frequent pausing exhibited in conversation, in which pt able to self-correct any anomia with extra time and/or anomia compensations. Increasingly complex vocabulary exhibited as ST sessions progressed, which was a personal goal for patient. Pt able to successfully utilize and implement cognitive compensations to aid recall, attention, and error awareness. Pt stated he is pleased with current progress and requested ST discharge on last scheduled visit.     Remaining deficits: NA   Education / Equipment: Anomia compensations, memory/attention strategies, error awareness, functional tasks    Patient agrees to discharge. Patient goals were met. Patient is being discharged due to meeting the stated rehab goals..       ADULT SLP TREATMENT - 05/24/21 1016       General Information   Behavior/Cognition Alert;Cooperative;Pleasant mood      Treatment Provided   Treatment provided Cognitive-Linquistic      Cognitive-Linquistic Treatment   Treatment focused on Cognition;Aphasia    Skilled Treatment "A lot of improvement" re: word finding and cognitive linguistic skills since initiation of ST intervention. Pt exhibits improvements in verbal expression, error awareness, attention, and recall with use of learned techniques and implementation of compensatory strategies. Self-advocacy reported, which has reduced patient frustration and cognitive burden. External aids (alarms, calendar, MyChart) has been successful. Cognitive Function -Short Form re-adminstered today, in which pt scored 32 (initial score of 16) indicating significant improvement. Pt is pleased with current progress and requested ST discharge on last scheduled visit.      Assessment / Recommendations / Plan   Plan Discharge SLP treatment due to (comment)   POC complete     Progression Toward Goals   Progression toward goals Goals met, education completed, patient discharged from SLP              SLP Education - 05/24/21 1049  Education Details discharge summary, recommendation and techniques to implement s/p ST discharge    Person(s) Educated Patient    Methods Explanation;Demonstration    Comprehension Verbalized understanding;Returned demonstration              SLP Short Term Goals - 05/14/21 1105       SLP SHORT TERM GOAL #1   Title Pt will complete CLQT and cognitive communication PROM in first ST session    Baseline 16 - Cognition Function Short Form     Status Achieved      SLP SHORT TERM GOAL #2   Title Pt will implement attention and memory compensations to aid daily functioning given occasional min A over 2 sessions    Baseline Not effectively compensating for deficits      04/23/21; 05-01-21    Status Achieved      SLP SHORT TERM GOAL #3   Title Pt will implement memory compensations to aid appointment management to attend 3/3 therapy sessions on time    Baseline Pt missed 2 ST evals; 05-03-21, 05-10-21, 05-14-21    Status Achieved      SLP SHORT TERM GOAL #4   Title Pt will use anomia compensations to aid word finding in 5 minute conversations given occasional min A over 2 sessions    Baseline word finding diffculty; 05-03-21, 05-10-21    Status Achieved      SLP SHORT TERM GOAL #5   Title Pt will utilize comprehension techniques to aid auditory comprehension during 5-10 minute video/presentation given occasional min A over 2 sessions    Baseline difficulty comprehending conversations; 05-07-21, 05-10-21    Status Achieved              SLP Long Term Goals - 05/24/21 1024       SLP LONG TERM GOAL #1   Title Pt will implement attention and memory compensations to aid daily functioning given rare min A over 2 sessions    Baseline Not compensating for deficits; 05-10-21, 05-24-21    Time --    Period --    Status Achieved      SLP LONG TERM GOAL #2   Title Pt will effectively manage appointments with no missed appointments reported over 2 weeks    Baseline Missed appointments    Time --    Period --    Status Partially Met      SLP LONG TERM GOAL #3   Title Pt will present work related presentation with use of anomia compensations given rare min A over 2 sessions    Baseline difficulty speaking during work presentations; 05-21-21, 05-23-21    Time --    Period --    Status Achieved      SLP LONG TERM GOAL #4   Title Pt will write 2 short stories with cohesive storyline and rare spelling errors given rare min A    Baseline  difficulty writing reported; 05-21-21, 05-24-21    Time --    Period --    Status Achieved              Plan - 05/24/21 1029     Clinical Impression Statement Tom Miranda has carried over internal/external compensatory strategies to aid recall, attention, and comprehension with success. Improvements in cognitive impairments and mild word finding difficulty demonstrated. SLP completed education and training completed for techniques to aid attention and error awareness to aid thought organization and word finding for more complex tasks. Pt is pleased with current  progress and agreeable to ST discharge on last scheduled date.    Speech Therapy Frequency 2x / week    Duration 8 weeks    Treatment/Interventions SLP instruction and feedback;Internal/external aids;Compensatory techniques;Language facilitation;Cognitive reorganization;Multimodal communcation approach;Functional tasks;Compensatory strategies;Patient/family education;Cueing hierarchy    Potential to Achieve Goals Good             Patient will benefit from skilled therapeutic intervention in order to improve the following deficits and impairments:   Cognitive communication deficit  Aphasia    Problem List Patient Active Problem List   Diagnosis Date Noted   No-show for appointment 05/14/2021   Concussion with no loss of consciousness 10/20/2020   Post-concussion headache 10/20/2020   Prediabetes 02/16/2020   Hyperlipidemia 02/16/2020   GERD (gastroesophageal reflux disease)    Obesity (BMI 30.0-34.9) 06/02/2019   Glaucoma 06/02/2019    Alinda Deem, CCC-SLP 05/24/2021, 10:50 AM  [facility] [address] [place], [place], [address] Phone: [phone]   Fax:  [phone]   Name: Tom Miranda MRN: [id number] Date of Birth: 03-Jun-1975

## 2021-05-24 NOTE — Patient Instructions (Addendum)
Take Robaxin (muscle relaxer) as needed at bedtime Start Maxalt as needed for migraine. Take at the onset of migraine. If headache recurs or does not fully resolve, you may take a second dose after 2 hours. Please avoid taking more than 2 days per week  Let me know if no improvement in 2-3 weeks and I will send in a daily preventive medication Consider blue light lenses for light sensitivity

## 2021-09-03 ENCOUNTER — Encounter: Payer: Self-pay | Admitting: Psychiatry

## 2021-09-03 ENCOUNTER — Ambulatory Visit: Payer: Medicaid Other | Admitting: Psychiatry

## 2021-09-03 NOTE — Progress Notes (Deleted)
   CC:  post-concussive syndrome  Follow-up Visit  Last visit: 05/24/21  Brief HPI: 46 year old male with a history of glaucoma, HLD, prediabetes and GERD who follows in clinic for headaches and cognitive changes following a concussion in June 2022. Brain MRI was normal.  At his last visit he was prescribed Maxalt and Robaxin as needed. Interval History: ***   Headache days per month: *** Headache free days per month: *** Headache severity: ***  Current Headache Regimen: Preventative: *** Abortive: ***  # of doses of abortive medications per month: ***  Prior Therapies                                  Gabapentin 300/300/900- lack of efficacy Goody's powder  Physical Exam:   Vital Signs: There were no vitals taken for this visit. GENERAL:  well appearing, in no acute distress, alert  SKIN:  Color, texture, turgor normal. No rashes or lesions HEAD:  Normocephalic/atraumatic. RESP: normal respiratory effort MSK:  No gross joint deformities.   NEUROLOGICAL: Mental Status: Alert, oriented to person, place and time, Follows commands, and Speech fluent and appropriate. Cranial Nerves: PERRL, face symmetric, no dysarthria, hearing grossly intact Motor: moves all extremities equally Gait: normal-based.  IMPRESSION: ***  PLAN: ***   Follow-up: ***  I spent a total of *** minutes on the date of the service. Headache education was done. Discussed lifestyle modification including increased oral hydration, decreased caffeine, exercise and stress management. Discussed treatment options including preventive and acute medications, natural supplements, and infusion therapy. Discussed medication overuse headache and to limit use of acute treatments to no more than 2 days/week or 10 days/month. Discussed medication side effects, adverse reactions and drug interactions. Written educational materials and patient instructions outlining all of the above were given.  Ocie Doyne,  MD

## 2021-09-18 ENCOUNTER — Encounter: Payer: Self-pay | Admitting: *Deleted

## 2021-10-31 ENCOUNTER — Ambulatory Visit (INDEPENDENT_AMBULATORY_CARE_PROVIDER_SITE_OTHER): Payer: Medicaid Other | Admitting: Student

## 2021-10-31 VITALS — BP 127/66 | HR 56 | Ht 68.0 in | Wt 213.0 lb

## 2021-10-31 DIAGNOSIS — R21 Rash and other nonspecific skin eruption: Secondary | ICD-10-CM

## 2021-10-31 MED ORDER — KETOCONAZOLE 2 % EX CREA: 1.0000 | TOPICAL_CREAM | Freq: Every day | CUTANEOUS | 0 refills | Status: AC

## 2021-10-31 NOTE — Patient Instructions (Addendum)
It was great to see you today! Thank you for choosing [facility] for your primary care. Astrid Drafts was seen for rash.  Today we addressed: I suspect this is either a fungal rash (tinea versicolor) or a dermatitis.  It is best to treat with an antifungal first and if this does not work then we can consider topical steroids.  I have prescribed you ketoconazole lotion to use daily.  If this does not resolve, please return and we can consider treating with topical steroids and perhaps get a scraping/biopsy.  Please stop using the Neosporin, alcohol, nystatin.  If you haven't already, sign up for My Chart to have easy access to your labs results, and communication with your primary care physician.  You should return to our clinic Return if symptoms worsen or fail to improve.  Please arrive 15 minutes before your appointment to ensure smooth check in process.  We appreciate your efforts in making this happen.  Please call the clinic at [phone] if your symptoms worsen or you have any concerns.  Thank you for allowing me to participate in your care, Shelby Mattocks, DO 10/31/2021, 11:50 AM PGY-2, [facility]

## 2021-10-31 NOTE — Progress Notes (Signed)
  SUBJECTIVE:   CHIEF COMPLAINT / HPI:   Presents with new rash that began 2 weeks ago on left ventral wrist and is now present on right upper arm and right ventral wrist.  It is itchy and has now responded to Neosporin, alcohol.  Uses Dove sensitive soap and has also tried nystatin powder for the last few days.  Denies any changes in detergents or soaps.  He wears a cloth sleeve for work on his left arm during his entire 8-hour shift and has been doing this for the last 1.5 months.  PERTINENT  PMH / PSH: Asthma  OBJECTIVE:  BP 127/66   Pulse (!) 56   Ht 5\' 8"  (1.727 m)   Wt 213 lb (96.6 kg)   SpO2 100%   BMI 32.39 kg/m   General: NAD, pleasant, able to participate in exam Skin: erythematous, annular plaques with raised borders and mixed hypopigmentation c/w tinea versicolor vs contact dermatitis present on L ventral wrist, R ventral wrist and R upper arm Psych: Normal affect and mood       ASSESSMENT/PLAN:  Rash and nonspecific skin eruption Tinea versicolor vs contact dermatitis.  Initiate ketoconazole 2% cream daily.  If not improved in 1 week, return for scraping or potential biopsy and treatment with topical steroids for dermatitis.  Return if symptoms worsen or fail to improve. , DO 11/01/2021, 7:48 AM PGY-2, [facility]

## 2021-11-01 NOTE — Assessment & Plan Note (Signed)
Tinea versicolor vs contact dermatitis.  Initiate ketoconazole 2% cream daily.  If not improved in 1 week, return for scraping or potential biopsy and treatment with topical steroids for dermatitis.

## 2021-11-25 NOTE — Progress Notes (Deleted)
  SUBJECTIVE:   CHIEF COMPLAINT / HPI:   Rash follow-up: Seen on 7/19 believed to be tinea versicolor versus contact dermatitis and prescribed ketoconazole 2% cream daily.  PERTINENT  PMH / PSH: Asthma  OBJECTIVE:  There were no vitals taken for this visit.  General: NAD, pleasant, able to participate in exam Cardiac: RRR, no murmurs auscultated Respiratory: CTAB, normal WOB Abdomen: soft, non-tender, non-distended, normoactive bowel sounds Extremities: warm and well perfused, no edema or cyanosis Skin: warm and dry, no rashes noted Neuro: alert, no obvious focal deficits, speech normal Psych: Normal affect and mood  ASSESSMENT/PLAN:  No problem-specific Assessment & Plan notes found for this encounter.   No orders of the defined types were placed in this encounter.  No orders of the defined types were placed in this encounter.  No follow-ups on file. Shelby Mattocks, DO 11/25/2021, 1:43 AM PGY-***, [facility] {    This will disappear when note is signed, click to select method of visit    :1}

## 2021-11-26 ENCOUNTER — Encounter: Payer: Self-pay | Admitting: Student

## 2021-11-26 ENCOUNTER — Ambulatory Visit: Payer: Medicaid Other | Admitting: Student

## 2021-11-26 VITALS — BP 106/60 | HR 94 | Wt 218.0 lb

## 2021-11-26 DIAGNOSIS — R21 Rash and other nonspecific skin eruption: Secondary | ICD-10-CM | POA: Diagnosis not present

## 2021-11-26 MED ORDER — TRIAMCINOLONE ACETONIDE 0.5 % EX OINT
1.0000 | TOPICAL_OINTMENT | Freq: Two times a day (BID) | CUTANEOUS | 3 refills | Status: DC
Start: 2021-11-26 — End: 2023-10-27

## 2021-11-26 NOTE — Progress Notes (Cosign Needed Addendum)
  Date of Visit: 11/26/2021   HPI:  Tom Miranda is a 46 y.o. male who presents for rash follow-up:  Rash He has been using ketoconazole 2% cream BID. It has helped with the itching. Itching is worse after showering. He reports the rash is worsening and spreading. The rash on the left ventral wrist is more erythematous than prior. The rash on the right ventral forearm has spread to his upper medial arm. He reports no new pets, soaps, detergents, or any lifestyle changes. He continues to wear his cotton sleeve on his left arm for work. He is requesting a work note to stop using the sleeve in case it caused the rash.  PHYSICAL EXAM: BP 106/60   Pulse 94   Wt 218 lb (98.9 kg)   SpO2 98%   BMI 33.15 kg/m  Gen: Well appearing male in no distress Derm: Scattered pruritic hypopigmented papules across left ventral wrist and right ventral upper and lower forearm.    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 Patient declined skin biopsy today. -Stop using Ketoconazole 2% cream BID -Start Triamcinolone 0.5% cream BID -Work note provided -Return to care if symptoms worsen or do not improve. Consider punch biopsy at next visit.  Cecilie Kicks, MS3 [facility]  I was personally present and performed or re-performed the history, physical exam and medical decision making activities of this service and have verified that the service and findings are accurately documented in the student's note.  Shelby Mattocks, DO                  11/26/2021, 3:39 PM

## 2021-11-26 NOTE — Patient Instructions (Addendum)
It was great to see you today! Thank you for choosing [facility] for your primary care. Astrid Drafts was seen for rash follow-up.  Today we addressed: I suspect that you have lichen planus and have attached a handout describing the condition.  You may stop using the ketoconazole cream.  We did not biopsy during this encounter but I have prescribed you triamcinolone 0.5% cream.  You may use this up to 2 times daily although I do not suggest this cream as regular use once the rash resolves.  Please apply moisturizer several times a day as well to ensure that you are skin stays healthy.  You may follow-up as needed if the rash does not improve and we can consider getting a biopsy at that time.  If you haven't already, sign up for My Chart to have easy access to your labs results, and communication with your primary care physician.  You should return to our clinic Return if symptoms worsen or fail to improve.  Please arrive 15 minutes before your appointment to ensure smooth check in process.  We appreciate your efforts in making this happen.  Please call the clinic at [phone] if your symptoms worsen or you have any concerns.  Thank you for allowing me to participate in your care, Shelby Mattocks, DO 11/26/2021, 2:38 PM PGY-2, [facility]

## 2021-11-26 NOTE — Assessment & Plan Note (Signed)
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 Patient declined skin biopsy today. -Stop using Ketoconazole 2% cream BID -Start Triamcinolone 0.5% cream BID -Work note provided -Return to care if symptoms worsen or do not improve. Consider punch biopsy at next visit.

## 2021-12-11 ENCOUNTER — Ambulatory Visit: Payer: Medicaid Other | Admitting: Student

## 2021-12-11 NOTE — Progress Notes (Deleted)
  SUBJECTIVE:   CHIEF COMPLAINT / HPI:   ***  PERTINENT  PMH / PSH: ***  Past Medical History:  Diagnosis Date   Asthma    GERD (gastroesophageal reflux disease)    Hyperlipidemia    Post concussion syndrome     OBJECTIVE:  There were no vitals taken for this visit.  General: NAD, pleasant, able to participate in exam Cardiac: RRR, no murmurs auscultated Respiratory: CTAB, normal WOB Abdomen: soft, non-tender, non-distended, normoactive bowel sounds Extremities: warm and well perfused, no edema or cyanosis Skin: warm and dry, no rashes noted Neuro: alert, no obvious focal deficits, speech normal Psych: Normal affect and mood  ASSESSMENT/PLAN:  No problem-specific Assessment & Plan notes found for this encounter.   No orders of the defined types were placed in this encounter.  No orders of the defined types were placed in this encounter.  No follow-ups on file. Shelby Mattocks, DO 12/11/2021, 8:35 AM PGY-***, [facility] {    This will disappear when note is signed, click to select method of visit    :1}

## 2022-03-29 ENCOUNTER — Encounter: Payer: Self-pay | Admitting: Student

## 2022-03-29 ENCOUNTER — Other Ambulatory Visit (HOSPITAL_COMMUNITY)
Admission: RE | Admit: 2022-03-29 | Discharge: 2022-03-29 | Disposition: A | Discharge status: Home / Self Care | Payer: Medicaid Other | Source: Ambulatory Visit | Attending: Family Medicine | Admitting: Family Medicine

## 2022-03-29 ENCOUNTER — Ambulatory Visit (INDEPENDENT_AMBULATORY_CARE_PROVIDER_SITE_OTHER): Payer: Medicaid Other | Admitting: Student

## 2022-03-29 VITALS — BP 110/68 | HR 67 | Ht 68.0 in | Wt 220.0 lb

## 2022-03-29 DIAGNOSIS — Z113 Encounter for screening for infections with a predominantly sexual mode of transmission: Secondary | ICD-10-CM | POA: Diagnosis not present

## 2022-03-29 DIAGNOSIS — Z0000 Encounter for general adult medical examination without abnormal findings: Secondary | ICD-10-CM

## 2022-03-29 DIAGNOSIS — R7303 Prediabetes: Secondary | ICD-10-CM | POA: Diagnosis not present

## 2022-03-29 NOTE — Patient Instructions (Addendum)
It was wonderful to meet  you today. Thank you for allowing me to be a part of your care. Below is a short summary of what we discussed at your visit today:  Your visit today we collected lab to test for STD which include HIV, chlamydia, gonorrhea and syphilis.  Order lab work include blood level, electrolyte, kidney function, and A1c to check your sugar level.  Advised you to continue your diet and try to exercise as much as you can.   If you have any questions or concerns, please do not hesitate to contact us via phone or MyChart message.   Jerre Simon, MD [facility]

## 2022-03-29 NOTE — Progress Notes (Signed)
    SUBJECTIVE:   Chief compliant/HPI: annual examination  Tom Miranda is a 46 y.o. who presents today for an annual exam.  Patient denies any medical concerns other than feeling drowsy occasionally he attributes to weather change.  Diet:Vegetarian, Cauliflower, mushroom , lettuce, peanuts Sleep: Poor sleep, gets 4-5 hours sleeps. Difficulty with falling asleep but able to sleep through once as sleep Exercise: Not exercising difficulty with schedule but stays active  Tobacco: Nonsmoker, quit over 20 years ago and only smoked for 2 years Alcohol use: No  Drug use: None Lives with: 7 kids and a grand kid  Work: Educational psychologist [employer] Sexually active with one partner without protection. No penile discharge or pain  Reviewed and updated history .      OBJECTIVE:   BP 110/68   Pulse 67   Ht 5\' 8"  (1.727 m)   Wt 220 lb (99.8 kg)   SpO2 99%   BMI 33.45 kg/m    General: Alert, well appearing, NAD HEENT: Atraumatic, MMM, No sclera icterus CV: RRR, no murmurs, normal S1/S2 Pulm: CTAB, good WOB on RA, no crackles or wheezing Abd: Soft, no distension, no tenderness Skin: dry, warm Ext: No BLE edema, +2 Pedal and radial pulse. Neuro: Cranial nerve II to XII intact, no focal neurologic deficit.  ASSESSMENT/PLAN:     Overall healthy 46 year old male with no medical concerns today.  Sleeping difficulties is likely due to poor sleeping hygiene.  Patient endorses being on the phone or watching TV prior to sleep and unable to turn off electronics before bed.  Discussed proper sleeping hygiene which includes setting bed time in which he will turn off all electronics and lights to enable him to fall asleep.  Annual Examination  See AVS for age appropriate recommendations  PHQ score 1, reviewed and discussed.  Blood pressure reviewed and at goal.     Considered the following items based upon USPSTF recommendations: HIV testing:  ordered Hepatitis C: ordered Hepatitis B: not indicated Syphilis if at high risk: {ordered GC/CTordered Lipid panel (nonfasting or fasting) discussed based upon AHA recommendations and ordered.  Consider repeat every 4-6 years.  Reviewed risk factors for latent tuberculosis and not indicated Immunizations: declined Flu and COVID vaccine.  Follow up in 1 year or sooner if indicated.    61, MD [facility]

## 2022-04-01 ENCOUNTER — Other Ambulatory Visit: Payer: Medicaid Other

## 2022-04-01 DIAGNOSIS — Z113 Encounter for screening for infections with a predominantly sexual mode of transmission: Secondary | ICD-10-CM | POA: Diagnosis not present

## 2022-04-01 DIAGNOSIS — Z0000 Encounter for general adult medical examination without abnormal findings: Secondary | ICD-10-CM | POA: Diagnosis not present

## 2022-04-01 LAB — POCT GLYCOSYLATED HEMOGLOBIN (HGB A1C): HbA1c, POC (controlled diabetic range): 6.2 % (ref 0.0–7.0)

## 2022-04-01 NOTE — Addendum Note (Signed)
Addended by: Georges Lynch T on: 04/01/2022 03:59 PM   Modules accepted: Orders

## 2022-04-01 NOTE — Addendum Note (Signed)
Addended by: Georges Lynch T on: 04/01/2022 04:10 PM   Modules accepted: Orders

## 2022-04-02 LAB — CBC WITH DIFFERENTIAL/PLATELET
Basophils Absolute: 0.1 10*3/uL (ref 0.0–0.2)
Basos: 1 %
EOS (ABSOLUTE): 0.1 10*3/uL (ref 0.0–0.4)
Eos: 2 %
Hematocrit: 43.6 % (ref 37.5–51.0)
Hemoglobin: 14.9 g/dL (ref 13.0–17.7)
Immature Grans (Abs): 0 10*3/uL (ref 0.0–0.1)
Immature Granulocytes: 0 %
Lymphocytes Absolute: 4.1 10*3/uL — ABNORMAL HIGH (ref 0.7–3.1)
Lymphs: 62 %
MCH: 29.6 pg (ref 26.6–33.0)
MCHC: 34.2 g/dL (ref 31.5–35.7)
MCV: 87 fL (ref 79–97)
Monocytes Absolute: 0.4 10*3/uL (ref 0.1–0.9)
Monocytes: 6 %
Neutrophils Absolute: 1.9 10*3/uL (ref 1.4–7.0)
Neutrophils: 29 %
Platelets: 225 10*3/uL (ref 150–450)
RBC: 5.04 x10E6/uL (ref 4.14–5.80)
RDW: 13.1 % (ref 11.6–15.4)
WBC: 6.5 10*3/uL (ref 3.4–10.8)

## 2022-04-02 LAB — HCV INTERPRETATION

## 2022-04-02 LAB — BASIC METABOLIC PANEL
BUN/Creatinine Ratio: 7 — ABNORMAL LOW (ref 9–20)
BUN: 8 mg/dL (ref 6–24)
CO2: 22 mmol/L (ref 20–29)
Calcium: 9.4 mg/dL (ref 8.7–10.2)
Chloride: 101 mmol/L (ref 96–106)
Creatinine, Ser: 1.07 mg/dL (ref 0.76–1.27)
Glucose: 96 mg/dL (ref 70–99)
Potassium: 4 mmol/L (ref 3.5–5.2)
Sodium: 139 mmol/L (ref 134–144)
eGFR: 88 mL/min/{1.73_m2} (ref >59)

## 2022-04-02 LAB — LIPID PANEL
Chol/HDL Ratio: 4.6 ratio (ref 0.0–5.0)
Cholesterol, Total: 194 mg/dL (ref 100–199)
HDL: 42 mg/dL (ref >39)
LDL Chol Calc (NIH): 114 mg/dL — ABNORMAL HIGH (ref 0–99)
Triglycerides: 215 mg/dL — ABNORMAL HIGH (ref 0–149)
VLDL Cholesterol Cal: 38 mg/dL (ref 5–40)

## 2022-04-02 LAB — HIV ANTIBODY (ROUTINE TESTING W REFLEX): HIV Screen 4th Generation wRfx: NONREACTIVE

## 2022-04-02 LAB — HCV AB W REFLEX TO QUANT PCR: HCV Ab: NONREACTIVE

## 2022-04-02 LAB — URINE CYTOLOGY ANCILLARY ONLY
Chlamydia: NEGATIVE
Comment: NEGATIVE
Comment: NORMAL
Neisseria Gonorrhea: NEGATIVE

## 2022-04-02 LAB — RPR: RPR Ser Ql: NONREACTIVE

## 2022-05-20 ENCOUNTER — Telehealth: Payer: Self-pay | Admitting: Neurology

## 2022-05-20 ENCOUNTER — Encounter: Payer: Self-pay | Admitting: Student

## 2022-05-20 ENCOUNTER — Ambulatory Visit (HOSPITAL_COMMUNITY)
Admission: RE | Admit: 2022-05-20 | Discharge: 2022-05-20 | Disposition: A | Discharge status: Home / Self Care | Payer: Medicaid Other | Source: Ambulatory Visit | Attending: Family Medicine | Admitting: Family Medicine

## 2022-05-20 ENCOUNTER — Ambulatory Visit (INDEPENDENT_AMBULATORY_CARE_PROVIDER_SITE_OTHER): Payer: Medicaid Other | Admitting: Student

## 2022-05-20 VITALS — BP 127/64 | HR 69 | Ht 68.0 in | Wt 221.1 lb

## 2022-05-20 DIAGNOSIS — R079 Chest pain, unspecified: Secondary | ICD-10-CM | POA: Diagnosis not present

## 2022-05-20 MED ORDER — FAMOTIDINE 40 MG PO TABS: 40.0000 mg | ORAL_TABLET | Freq: Every day | ORAL | 1 refills | Status: DC

## 2022-05-20 NOTE — Patient Instructions (Addendum)
It was great to see you today! Thank you for choosing [facility] for your primary care. Tom Miranda was seen for follow up.  Today we addressed: - Continue with the famotidine regularly  - We will schedule the echo   If you haven't already, sign up for My Chart to have easy access to your labs results, and communication with your primary care physician.  I recommend that you always bring your medications to each appointment as this makes it easy to ensure you are on the correct medications and helps [place] not miss refills when you need them. Call the clinic at [phone] if your symptoms worsen or you have any concerns.  You should return to our clinic Return in about 3 months (around 08/18/2022). Please arrive 15 minutes before your appointment to ensure smooth check in process.  We appreciate your efforts in making this happen.  Thank you for allowing me to participate in your care, Erskine Emery, MD 05/20/2022, 4:16 PM PGY-2, [place]

## 2022-05-20 NOTE — Telephone Encounter (Signed)
Please call pt back. He has not been seen in over a year. He would need to come in for updated visit to further discuss.

## 2022-05-20 NOTE — Telephone Encounter (Signed)
Pt is calling. Stated he needs a letter about his head injury. Stated his job is requiring a hard hat and that one is causing headache. Pt is requesting a call back from nurse.

## 2022-05-20 NOTE — Progress Notes (Unsigned)
  SUBJECTIVE:   CHIEF COMPLAINT / HPI:   Acid Reflux:  Pressure in the chest that feels better  Acidic  Been out famotidine     PERTINENT  PMH / PSH:   Past Medical History:  Diagnosis Date   Asthma    GERD (gastroesophageal reflux disease)    Hyperlipidemia    Post concussion syndrome     OBJECTIVE:  BP 127/64   Pulse 69   Ht 5\' 8"  (1.727 m)   Wt 221 lb 2 oz (100.3 kg)   SpO2 100%   BMI 33.62 kg/m  Physical Exam   ASSESSMENT/PLAN:  There are no diagnoses linked to this encounter. No follow-ups on file. Erskine Emery, MD 05/20/2022, 3:52 PM PGY-***, [facility] {    This will disappear when note is signed, click to select method of visit    :1}

## 2022-05-20 NOTE — Telephone Encounter (Signed)
Dr.Chima please see the below messages, okay for patient to schedule with NP?

## 2022-05-20 NOTE — Telephone Encounter (Addendum)
Called pt back. Pt is asking if he can f/u up with a NP because he can't wait months to see Dr. Billey Gosling. Pt is requesting a call back

## 2022-05-21 NOTE — Telephone Encounter (Signed)
Please see below response from [place]

## 2022-05-21 NOTE — Assessment & Plan Note (Signed)
Given his symptoms, I feel he likely has GERD contributing to his chest pain.  However, he has multiple PVCs on EKG and correlating symptoms with a history of atypical chest pain.  With active check electrolytes with BMP, patient reports he will return for the labs so future order was placed.  Additionally, I feel he would benefit from a repeat echo. Discussed this with the patient and verbalizes understanding. Strict return precautions provided and famotidine refill given.

## 2022-05-21 NOTE — Telephone Encounter (Signed)
OK for him to schedule with an NP

## 2022-05-22 NOTE — Telephone Encounter (Signed)
Scheduled pt appt with Amy 06/25/22 at 2 pm

## 2022-05-27 ENCOUNTER — Ambulatory Visit (HOSPITAL_COMMUNITY)
Admission: RE | Admit: 2022-05-27 | Discharge: 2022-05-27 | Disposition: A | Discharge status: Home / Self Care | Payer: Medicaid Other | Source: Ambulatory Visit | Attending: Family Medicine | Admitting: Family Medicine

## 2022-05-27 DIAGNOSIS — R079 Chest pain, unspecified: Secondary | ICD-10-CM | POA: Insufficient documentation

## 2022-05-27 DIAGNOSIS — Z87891 Personal history of nicotine dependence: Secondary | ICD-10-CM | POA: Insufficient documentation

## 2022-05-27 DIAGNOSIS — R9431 Abnormal electrocardiogram [ECG] [EKG]: Secondary | ICD-10-CM | POA: Diagnosis not present

## 2022-05-27 DIAGNOSIS — E785 Hyperlipidemia, unspecified: Secondary | ICD-10-CM | POA: Insufficient documentation

## 2022-05-27 LAB — ECHOCARDIOGRAM COMPLETE
Area-P 1/2: 4.96 cm2
Calc EF: 40.8 %
S' Lateral: 4.4 cm
Single Plane A2C EF: 48.7 %
Single Plane A4C EF: 32.6 %

## 2022-05-27 NOTE — Progress Notes (Signed)
  Echocardiogram 2D Echocardiogram has been performed.  Tom Miranda 05/27/2022, 12:06 PM

## 2022-05-30 ENCOUNTER — Encounter: Payer: Self-pay | Admitting: Nurse Practitioner

## 2022-05-30 ENCOUNTER — Ambulatory Visit: Payer: Medicaid Other | Attending: Adult Health | Admitting: Nurse Practitioner

## 2022-05-30 ENCOUNTER — Ambulatory Visit (INDEPENDENT_AMBULATORY_CARE_PROVIDER_SITE_OTHER): Payer: Medicaid Other

## 2022-05-30 VITALS — BP 114/56 | HR 45 | Ht 68.0 in | Wt 219.8 lb

## 2022-05-30 DIAGNOSIS — R079 Chest pain, unspecified: Secondary | ICD-10-CM | POA: Diagnosis not present

## 2022-05-30 DIAGNOSIS — I493 Ventricular premature depolarization: Secondary | ICD-10-CM

## 2022-05-30 DIAGNOSIS — K219 Gastro-esophageal reflux disease without esophagitis: Secondary | ICD-10-CM | POA: Diagnosis not present

## 2022-05-30 DIAGNOSIS — I519 Heart disease, unspecified: Secondary | ICD-10-CM

## 2022-05-30 DIAGNOSIS — E782 Mixed hyperlipidemia: Secondary | ICD-10-CM

## 2022-05-30 NOTE — Progress Notes (Signed)
Office Visit    Patient Name: Tom Miranda Date of Encounter: 05/30/2022  Primary Care Provider:  Alen Bleacher, MD Primary Cardiologist:  Kirk Ruths, MD  Chief Complaint    47 year old male with a history of chest pain, LV dysfunction, hyperlipidemia, and GERD who presents for follow-up related to chest pain, LV dysfunction.  Past Medical History    Past Medical History:  Diagnosis Date   Asthma    GERD (gastroesophageal reflux disease)    Hyperlipidemia    Post concussion syndrome    Past Surgical History:  Procedure Laterality Date   No prior surgery      Allergies  No Known Allergies   Labs/Other Studies Reviewed    The following studies were reviewed today: CCTA 02/2021:  IMPRESSION: 1.  Coronary calcium score of 0.   2.  Normal coronary origin with right dominance.   3. Poor quality study with significant step artifacts due to cardiac motion but no CAD seen   4. LV appears hypertrabeculated and mildly dilated, consider echocardiogram for further evaluation   CAD-RADS 0. No evidence of CAD (0%). Consider non-atherosclerotic causes of chest pain.   Echo 02/2021: IMPRESSIONS    1. Left ventricular ejection fraction, by estimation, is 50 to 55%. Left  ventricular ejection fraction by 3D volume is 52 %. The left ventricle has  low normal function. The left ventricle has no regional wall motion  abnormalities. Left ventricular  diastolic parameters were normal. The average left ventricular global  longitudinal strain is 17.4 %. The global longitudinal strain is normal.   2. Right ventricular systolic function is normal. The right ventricular  size is normal.   3. The mitral valve is normal in structure. Trivial mitral valve  regurgitation. No evidence of mitral stenosis.   4. The aortic valve is grossly normal. Aortic valve regurgitation is not  visualized. No aortic stenosis is present.   Echo 05/2022:  IMPRESSIONS    1. Left ventricular  ejection fraction, by estimation, is 40 to 45%. The  left ventricle has mildly decreased function. The left ventricle  demonstrates global hypokinesis. Left ventricular diastolic parameters are  indeterminate.   2. Right ventricular systolic function is normal. The right ventricular  size is normal. Tricuspid regurgitation signal is inadequate for assessing  PA pressure.   3. No evidence of mitral valve regurgitation.   4. Aortic valve regurgitation is not visualized.   5. The inferior vena cava is normal in size with greater than 50%  respiratory variability, suggesting right atrial pressure of 3 mmHg.   Conclusion(s)/Recommendation(s): EF more reduced compared to prior study.  Cannot rule out noncompaction, consider cardiac MRI.   Recent Labs: 04/01/2022: BUN 8; Creatinine, Ser 1.07; Hemoglobin 14.9; Platelets 225; Potassium 4.0; Sodium 139  Recent Lipid Panel    Component Value Date/Time   CHOL 194 04/01/2022 1650   TRIG 215 (H) 04/01/2022 1650   HDL 42 04/01/2022 1650   CHOLHDL 4.6 04/01/2022 1650   LDLCALC 114 (H) 04/01/2022 1650   LDLDIRECT 163 (H) 02/15/2020 1642    History of Present Illness    47 year old male with the above past medical history including chest pain, LV dysfunction, hyperlipidemia, and GERD.  He was initially evaluated by Dr. Stanford Breed on 01/31/2021 in the setting of chest pain.  Coronary CTA revealed coronary calcium score of 0, no evidence of CAD.  Echocardiogram was normal. It was felt that his symptoms were likely related to GERD.  He has not been seen  in follow-up since.  He saw his PCP on 05/20/2022 with complaints of chest discomfort, described as a pressure, relieved with belching.  EKG showed sinus rhythm with PVCs.  Repeat echocardiogram showed EF 40 to 45%, mildly decreased LV function, LV global hypokinesis, indeterminate diastolic parameters, normal RV systolic function, no significant valvular abnormalities.  Cardiac MRI was recommended to rule out  noncompaction.  He presents today for follow-up. Since his last visit he has been stable overall from a cardiac standpoint.  He has noted some epigastric chest discomfort which he describes as a pressure that it is relieved with palpation and belching.  Similar to prior symptoms that prompted evaluation with coronary CTA. He denies any palpitations, dizziness, syncope, syncope, dyspnea, edema, PND, orthopnea, weight gain.    Home Medications    Current Outpatient Medications  Medication Sig Dispense Refill   [employer] MELATONIN 3 MG TABS tablet TAKE 1 TABLET BY MOUTH AT BEDTIME. 30 tablet 0   famotidine (PEPCID) 40 MG tablet Take 1 tablet (40 mg total) by mouth daily. 90 tablet 1   gabapentin (NEURONTIN) 300 MG capsule Take 300 mg (1 pill) at bedtime for one week, then increase to 600 mg (2 pills) at bedtime one week, then take 900 mg (3 pills) at bedtime 90 capsule 3   ibuprofen (ADVIL) 200 MG tablet Take 200 mg by mouth every 6 (six) hours as needed.     Magnesium Oxide 400 MG CAPS Take 1 capsule (400 mg total) by mouth daily as needed (can take daily for 3-4 days then daily as needed for headache). 30 capsule 0   methocarbamol (ROBAXIN) 500 MG tablet Take 1 tablet (500 mg total) by mouth at bedtime as needed for muscle spasms. 30 tablet 3   rizatriptan (MAXALT) 10 MG tablet Take 1 tablet (10 mg total) by mouth as needed for migraine. May repeat in 2 hours if needed 10 tablet 3   triamcinolone ointment (KENALOG) 0.5 % Apply 1 Application topically 2 (two) times daily. 60 g 3   No current facility-administered medications for this visit.     Review of Systems    He denies chest pain, palpitations, dyspnea, pnd, orthopnea, n, v, dizziness, syncope, edema, weight gain, or early satiety. All other systems reviewed and are otherwise negative except as noted above.   Physical Exam    VS:  BP (!) 114/56 (BP Location: Right Arm, Patient Position: Sitting, Cuff Size: Normal)   Pulse (!) 45   Ht 5' 8"$   (1.727 m)   Wt 219 lb 12.8 oz (99.7 kg)   SpO2 98%   BMI 33.42 kg/m  GEN: Well nourished, well developed, in no acute distress. HEENT: normal. Neck: Supple, no JVD, carotid bruits, or masses. Cardiac: RRR, no murmurs, rubs, or gallops. No clubbing, cyanosis, edema.  Radials/DP/PT 2+ and equal bilaterally.  Respiratory:  Respirations regular and unlabored, clear to auscultation bilaterally. GI: Soft, nontender, nondistended, BS + x 4. MS: no deformity or atrophy. Skin: warm and dry, no rash. Neuro:  Strength and sensation are intact. Psych: Normal affect.  Accessory Clinical Findings    ECG personally reviewed by me today -sinus rhythm, 96 bpm, frequent PVCs (bigeminy/trigeminy).  Lab Results  Component Value Date   WBC 6.5 04/01/2022   HGB 14.9 04/01/2022   HCT 43.6 04/01/2022   MCV 87 04/01/2022   PLT 225 04/01/2022   Lab Results  Component Value Date   CREATININE 1.07 04/01/2022   BUN 8 04/01/2022   NA 139  04/01/2022   K 4.0 04/01/2022   CL 101 04/01/2022   CO2 22 04/01/2022   Lab Results  Component Value Date   ALT 22 02/07/2021   AST 26 02/07/2021   ALKPHOS 71 02/07/2021   BILITOT 0.5 02/07/2021   Lab Results  Component Value Date   CHOL 194 04/01/2022   HDL 42 04/01/2022   LDLCALC 114 (H) 04/01/2022   LDLDIRECT 163 (H) 02/15/2020   TRIG 215 (H) 04/01/2022   CHOLHDL 4.6 04/01/2022    Lab Results  Component Value Date   HGBA1C 6.2 04/01/2022    Assessment & Plan    1. Chest pain: Coronary CTA revealed coronary calcium score of 0, no evidence of CAD.  He has had recent epigastric discomfort similar to prior GERD, relieved with palpation and belching.  Denies any other symptoms concerning for angina.  cMRI pending as below.  2. LV dysfunction: Echo in 2022 was essentially normal.  Repeat echo in 05/2022 showed EF 40 to 45%, mildly decreased LV function, LV global hypokinesis, indeterminate diastolic parameters, normal RV systolic function, no significant  valvular abnormalities.  Euvolemic and well compensated on exam.  Dr. Stanford Breed reviewed echocardiogram, who felt there was no evidence of noncompaction. However, he agreed to proceed with cardiac MRI to rule out possible infiltrative process. He did have frequent PVCs on EKG at today's visit.  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 Cardiac monitor pending as below.  3. Bradycardia/frequent PVCs: HR recorded as 45 bpm with pulse ox today.  However, EKG shows sinus rhythm with frequent PVCs (bigeminy, trigeminy, 96 bpm).  Patient is asymptomatic. However, this could explain his new cardiomyopathy.  Will obtain 14-day ZIO monitor.  We discussed possible initiation of beta-blocker, possible EP referral if he has high PVC burden.  Discussed ED precautions. Patient verbalized understanding. Will check BMET, TSH, Mg today.   4. Hyperlipidemia: LDL was 114 in 03/2022.  Continue ongoing lifestyle modifications with diet and exercise.  5. GERD: He has had some recent epigastric discomfort similar to prior GERD.  Follows with GI.   6. Disposition: Follow-up in 6 to 8 weeks.     Lenna Sciara, NP 05/30/2022, 5:26 PM

## 2022-05-30 NOTE — Progress Notes (Unsigned)
Enrolled for [employer] to mail a ZIO XT long term holter monitor to the patients address on file.   Dr. Crenshaw to read. 

## 2022-05-30 NOTE — Patient Instructions (Addendum)
Medication Instructions:  Your physician recommends that you continue on your current medications as directed. Please refer to the Current Medication list given to you today.  *If you need a refill on your cardiac medications before your next appointment, please call your pharmacy*   Lab Work: Your physician recommends that you complete lab work today.   BMET, Magnesium, TSH  If you have labs (blood work) drawn today and your tests are completely normal, you will receive your results only by: [place] (if you have MyChart) OR A paper copy in the mail If you have any lab test that is abnormal or we need to change your treatment, we will call you to review the results.   Testing/Procedures: Your physician has requested that you have a cardiac MRI. Cardiac MRI uses a computer to create images of your heart as its beating, producing both still and moving pictures of your heart and major blood vessels. For further information please visit [email or web address] Please follow the instruction sheet given to you today for more information.    ZIO XT- Long Term Monitor Instructions  Your physician has requested you wear a ZIO patch monitor for 14 days.  This is a single patch monitor. [employer] supplies one patch monitor per enrollment. Additional stickers are not available. Please do not apply patch if you will be having a Nuclear Stress Test,  Echocardiogram, Cardiac CT, MRI, or Chest Xray during the period you would be wearing the  monitor. The patch cannot be worn during these tests. You cannot remove and re-apply the  ZIO XT patch monitor.  Your ZIO patch monitor will be mailed 3 day USPS to your address on file. It may take 3-5 days  to receive your monitor after you have been enrolled.  Once you have received your monitor, please review the enclosed instructions. Your monitor  has already been registered assigning a specific monitor serial # to you.  Billing and Patient Assistance  Program Information  We have supplied [employer] with any of your insurance information on file for billing purposes. [employer] offers a sliding scale Patient Assistance Program for patients that do not have  insurance, or whose insurance does not completely cover the cost of the ZIO monitor.  You must apply for the Patient Assistance Program to qualify for this discounted rate.  To apply, please call [employer] at [phone], select option 4, select option 2, ask to apply for  Patient Assistance Program. Theodore Demark will ask your household income, and how many people  are in your household. They will quote your out-of-pocket cost based on that information.  [employer] will also be able to set up a 83-month interest-free payment plan if needed.  Applying the monitor   Shave hair from upper left chest.  Hold abrader disc by orange tab. Rub abrader in 40 strokes over the upper left chest as  indicated in your monitor instructions.  Clean area with 4 enclosed alcohol pads. Let dry.  Apply patch as indicated in monitor instructions. Patch will be placed under collarbone on left  side of chest with arrow pointing upward.  Rub patch adhesive wings for 2 minutes. Remove white label marked "1". Remove the white  label marked "2". Rub patch adhesive wings for 2 additional minutes.  While looking in a mirror, press and release button in center of patch. A small green light will  flash 3-4 times. This will be your only indicator that the monitor has been turned on.  Do not shower for  the first 24 hours. You may shower after the first 24 hours.  Press the button if you feel a symptom. You will hear a small click. Record Date, Time and  Symptom in the Patient Logbook.  When you are ready to remove the patch, follow instructions on the last 2 pages of Patient  Logbook. Stick patch monitor onto the last page of Patient Logbook.  Place Patient Logbook in the blue and white box. Use locking tab on box and tape box  closed  securely. The blue and white box has prepaid postage on it. Please place it in the mailbox as  soon as possible. Your physician should have your test results approximately 7 days after the  monitor has been mailed back to [facility].  Call [place] at [phone] if you have questions regarding  your ZIO XT patch monitor. Call them immediately if you see an orange light blinking on your  monitor.  If your monitor falls off in less than 4 days, contact our Monitor department at [phone].  If your monitor becomes loose or falls off after 4 days call [employer] at [phone] for  suggestions on securing your monitor   Follow-Up: At [facility], you and your health needs are our priority.  As part of our continuing mission to provide you with exceptional heart care, we have created designated Provider Care Teams.  These Care Teams include your primary Cardiologist (physician) and Advanced Practice Providers (APPs -  Physician Assistants and Nurse Practitioners) who all work together to provide you with the care you need, when you need it.  We recommend signing up for the patient portal called "MyChart".  Sign up information is provided on this After Visit Summary.  MyChart is used to connect with patients for Virtual Visits (Telemedicine).  Patients are able to view lab/test results, encounter notes, upcoming appointments, etc.  Non-urgent messages can be sent to your provider as well.   To learn more about what you can do with MyChart, go to [email or web address].    Your next appointment:   6-8 week(s)  Provider:   Diona Browner, NP        Other Instructions

## 2022-05-31 ENCOUNTER — Ambulatory Visit: Payer: Medicaid Other | Admitting: Adult Health

## 2022-05-31 DIAGNOSIS — K219 Gastro-esophageal reflux disease without esophagitis: Secondary | ICD-10-CM | POA: Diagnosis not present

## 2022-05-31 DIAGNOSIS — R079 Chest pain, unspecified: Secondary | ICD-10-CM | POA: Diagnosis not present

## 2022-05-31 DIAGNOSIS — I519 Heart disease, unspecified: Secondary | ICD-10-CM | POA: Diagnosis not present

## 2022-05-31 DIAGNOSIS — E782 Mixed hyperlipidemia: Secondary | ICD-10-CM | POA: Diagnosis not present

## 2022-06-01 LAB — BASIC METABOLIC PANEL
BUN/Creatinine Ratio: 10 (ref 9–20)
BUN: 10 mg/dL (ref 6–24)
CO2: 23 mmol/L (ref 20–29)
Calcium: 9.9 mg/dL (ref 8.7–10.2)
Chloride: 100 mmol/L (ref 96–106)
Creatinine, Ser: 1.03 mg/dL (ref 0.76–1.27)
Glucose: 120 mg/dL — ABNORMAL HIGH (ref 70–99)
Potassium: 4.2 mmol/L (ref 3.5–5.2)
Sodium: 140 mmol/L (ref 134–144)
eGFR: 92 mL/min/{1.73_m2} (ref >59)

## 2022-06-01 LAB — MAGNESIUM: Magnesium: 2 mg/dL (ref 1.6–2.3)

## 2022-06-01 LAB — TSH: TSH: 0.883 u[IU]/mL (ref 0.450–4.500)

## 2022-06-03 ENCOUNTER — Telehealth: Payer: Self-pay

## 2022-06-03 NOTE — Telephone Encounter (Signed)
Left a detailed message with lab results. Pt advised to continue his current medication and f/u as planned.

## 2022-06-04 DIAGNOSIS — I519 Heart disease, unspecified: Secondary | ICD-10-CM

## 2022-06-04 DIAGNOSIS — I493 Ventricular premature depolarization: Secondary | ICD-10-CM | POA: Diagnosis not present

## 2022-06-05 ENCOUNTER — Telehealth: Payer: Self-pay | Admitting: Nurse Practitioner

## 2022-06-05 ENCOUNTER — Encounter (HOSPITAL_COMMUNITY): Payer: Self-pay

## 2022-06-05 NOTE — Telephone Encounter (Signed)
Patient called stating he needs a letter for work stating he is wearing a monitor and if there are any restrictions while wearing the monitor.

## 2022-06-05 NOTE — Telephone Encounter (Signed)
Are you okay to write a letter that he is wearing a monitor?   I know there are not restrictions, but wanted to check.  Thanks !

## 2022-06-07 NOTE — Telephone Encounter (Signed)
Letter sent via my chart

## 2022-06-10 ENCOUNTER — Telehealth: Payer: Self-pay | Admitting: Nurse Practitioner

## 2022-06-10 NOTE — Telephone Encounter (Signed)
Noted  

## 2022-06-10 NOTE — Telephone Encounter (Signed)
Patient returning call for lab and monitor results.

## 2022-06-10 NOTE — Telephone Encounter (Signed)
Ai did not need the encounter

## 2022-06-10 NOTE — Telephone Encounter (Signed)
Spoke with pt, aware of lab results. He is still wearing the monitor. Work note printed and placed at the front desk for pick up.

## 2022-06-24 NOTE — Patient Instructions (Incomplete)
Below is our plan:  We will continue to monitor for now. I would like to wait until cardiology reviewed heart monitor results before choosing a medication. We could try one of two things:  We may consider propranolol which would likely be a 20mg  dose taking twice daily.   May consider starting start amitriptyline 10mg  daily at bedtime about 30 minutes before you are ready to go to sleep (shoot for around 10-10:30). Try to get at least 7 hours of sleep every night.   Continue rizatriptan and methocarbamol for abortive therapy.   May consider using alternative protective hat while working to see if this helps with headaches.   Please make sure you are staying well hydrated. I recommend 50-60 ounces daily. Well balanced diet and regular exercise encouraged. Consistent sleep schedule with 6-8 hours recommended.   Please continue follow up with care team as directed.   Follow up with me in 6 months   You may receive a survey regarding today's visit. I encourage you to leave honest feed back as I do use this information to improve patient care. Thank you for seeing me today!

## 2022-06-24 NOTE — Progress Notes (Unsigned)
No chief complaint on file.   HISTORY OF PRESENT ILLNESS:  06/24/22 ALL:  Tom Miranda is a 47 y.o. male here today for follow up for post concussive headaches. He was last seen by Dr Billey Gosling 05/2021 and advised to use rizatriptan and methocarbamol PRN for acute management of headaches. Since,    HISTORY (copied from Dr Georgina Peer previous note)  Brief HPI: 47 year old male with a history of glaucoma, HLD, prediabetes and GERD who follows in clinic for headaches and cognitive changes following a concussion in June 2022.   At his last visit MRI brain was ordered and he was referred to ophthalmology for decreased vision in his left eye. Gabapentin was started for prevention and he was referred to neck PT and cognitive rehab.   Interval History: Since his last visit he has improved, though he is continuing to have headaches every night. Has a lot of trouble falling asleep at night which he thinks is making his headaches worse. Gabapentin didn't help so he stopped taking it. He is taking Goody powder ~3 days per week.    Neck PT has helped reduce his neck pain. Cognitive rehab has been helpful, feels he's about 70% better.   Coronary artery CT without evidence of CAD.   Saw the ophthalmologist who noted worsening of glaucoma and started him on eye drops   MRI brain was normal (images reviewed personally 05/24/21)   REVIEW OF SYSTEMS: Out of a complete 14 system review of symptoms, the patient complains only of the following symptoms, headaches and all other reviewed systems are negative.   ALLERGIES: No Known Allergies   HOME MEDICATIONS: Outpatient Medications Prior to Visit  Medication Sig Dispense Refill   [facility] MELATONIN 3 MG TABS tablet TAKE 1 TABLET BY MOUTH AT BEDTIME. 30 tablet 0   famotidine (PEPCID) 40 MG tablet Take 1 tablet (40 mg total) by mouth daily. 90 tablet 1   gabapentin (NEURONTIN) 300 MG capsule Take 300 mg (1 pill) at bedtime for one week, then increase to 600  mg (2 pills) at bedtime one week, then take 900 mg (3 pills) at bedtime 90 capsule 3   ibuprofen (ADVIL) 200 MG tablet Take 200 mg by mouth every 6 (six) hours as needed.     Magnesium Oxide 400 MG CAPS Take 1 capsule (400 mg total) by mouth daily as needed (can take daily for 3-4 days then daily as needed for headache). 30 capsule 0   methocarbamol (ROBAXIN) 500 MG tablet Take 1 tablet (500 mg total) by mouth at bedtime as needed for muscle spasms. 30 tablet 3   rizatriptan (MAXALT) 10 MG tablet Take 1 tablet (10 mg total) by mouth as needed for migraine. May repeat in 2 hours if needed 10 tablet 3   triamcinolone ointment (KENALOG) 0.5 % Apply 1 Application topically 2 (two) times daily. 60 g 3   No facility-administered medications prior to visit.     PAST MEDICAL HISTORY: Past Medical History:  Diagnosis Date   Asthma    GERD (gastroesophageal reflux disease)    Hyperlipidemia    Post concussion syndrome      PAST SURGICAL HISTORY: Past Surgical History:  Procedure Laterality Date   No prior surgery       FAMILY HISTORY: Family History  Problem Relation Age of Onset   Glaucoma Mother      SOCIAL HISTORY: Social History   Socioeconomic History   Marital status: Single    Spouse  name: Not on file   Number of children: 7   Years of education: Not on file   Highest education level: Not on file  Occupational History   Not on file  Tobacco Use   Smoking status: Former    Types: Cigarettes    Passive exposure: Past   Smokeless tobacco: Never  Substance and Sexual Activity   Alcohol use: Yes    Comment: Occasional   Drug use: No   Sexual activity: Not on file  Other Topics Concern   Not on file  Social History Narrative   Right handed   Some caffeine    Social Determinants of Health   Financial Resource Strain: Not on file  Food Insecurity: Not on file  Transportation Needs: Not on file  Physical Activity: Not on file  Stress: Not on file  Social  Connections: Not on file  Intimate Partner Violence: Not on file     PHYSICAL EXAM  There were no vitals filed for this visit. There is no height or weight on file to calculate BMI.  Generalized: Well developed, in no acute distress  Cardiology: normal rate and rhythm, no murmur auscultated  Respiratory: clear to auscultation bilaterally    Neurological examination  Mentation: Alert oriented to time, place, history taking. Follows all commands speech and language fluent Cranial nerve II-XII: Pupils were equal round reactive to light. Extraocular movements were full, visual field were full on confrontational test. Facial sensation and strength were normal. Uvula tongue midline. Head turning and shoulder shrug  were normal and symmetric. Motor: The motor testing reveals 5 over 5 strength of all 4 extremities. Good symmetric motor tone is noted throughout.  Sensory: Sensory testing is intact to soft touch on all 4 extremities. No evidence of extinction is noted.  Coordination: Cerebellar testing reveals good finger-nose-finger and heel-to-shin bilaterally.  Gait and station: Gait is normal. Tandem gait is normal. Romberg is negative. No drift is seen.  Reflexes: Deep tendon reflexes are symmetric and normal bilaterally.    DIAGNOSTIC DATA (LABS, IMAGING, TESTING) - I reviewed patient records, labs, notes, testing and imaging myself where available.  Lab Results  Component Value Date   WBC 6.5 04/01/2022   HGB 14.9 04/01/2022   HCT 43.6 04/01/2022   MCV 87 04/01/2022   PLT 225 04/01/2022      Component Value Date/Time   NA 140 05/31/2022 1637   K 4.2 05/31/2022 1637   CL 100 05/31/2022 1637   CO2 23 05/31/2022 1637   GLUCOSE 120 (H) 05/31/2022 1637   GLUCOSE 97 08/18/2019 1238   BUN 10 05/31/2022 1637   CREATININE 1.03 05/31/2022 1637   CALCIUM 9.9 05/31/2022 1637   PROT 7.8 02/07/2021 1132   ALBUMIN 4.9 02/07/2021 1132   AST 26 02/07/2021 1132   ALT 22 02/07/2021 1132    ALKPHOS 71 02/07/2021 1132   BILITOT 0.5 02/07/2021 1132   GFRNONAA >60 08/18/2019 1238   GFRAA >60 08/18/2019 1238   Lab Results  Component Value Date   CHOL 194 04/01/2022   HDL 42 04/01/2022   LDLCALC 114 (H) 04/01/2022   LDLDIRECT 163 (H) 02/15/2020   TRIG 215 (H) 04/01/2022   CHOLHDL 4.6 04/01/2022   Lab Results  Component Value Date   HGBA1C 6.2 04/01/2022   No results found for: "VITAMINB12" Lab Results  Component Value Date   TSH 0.883 05/31/2022        No data to display  No data to display           ASSESSMENT AND PLAN  47 y.o. year old male  has a past medical history of Asthma, GERD (gastroesophageal reflux disease), Hyperlipidemia, and Post concussion syndrome. here with    No diagnosis found.  [place] ***.  Healthy lifestyle habits encouraged. *** will follow up with PCP as directed. *** will return to see me in ***, sooner if needed. *** verbalizes understanding and agreement with this plan.   No orders of the defined types were placed in this encounter.    No orders of the defined types were placed in this encounter.    Debbora Presto, MSN, FNP-C 06/24/2022, 9:01 AM  [facility] [address], [place], [place] [address] [phone]

## 2022-06-25 ENCOUNTER — Ambulatory Visit: Payer: Medicaid Other | Admitting: Family Medicine

## 2022-06-25 ENCOUNTER — Encounter: Payer: Self-pay | Admitting: Family Medicine

## 2022-06-25 VITALS — BP 117/68 | HR 96 | Ht 68.0 in | Wt 221.6 lb

## 2022-06-25 DIAGNOSIS — G44209 Tension-type headache, unspecified, not intractable: Secondary | ICD-10-CM

## 2022-06-25 DIAGNOSIS — F0781 Postconcussional syndrome: Secondary | ICD-10-CM

## 2022-06-25 MED ORDER — AMITRIPTYLINE HCL 10 MG PO TABS: 10.0000 mg | ORAL_TABLET | Freq: Every day | ORAL | 1 refills | Status: DC

## 2022-06-25 MED ORDER — METHOCARBAMOL 500 MG PO TABS: 500.0000 mg | ORAL_TABLET | Freq: Every evening | ORAL | 0 refills | Status: DC | PRN

## 2022-06-25 MED ORDER — RIZATRIPTAN BENZOATE 10 MG PO TABS: 10.0000 mg | ORAL_TABLET | ORAL | 11 refills | Status: DC | PRN

## 2022-07-17 ENCOUNTER — Ambulatory Visit: Payer: Medicaid Other | Attending: Nurse Practitioner | Admitting: Nurse Practitioner

## 2022-07-17 NOTE — Progress Notes (Deleted)
Office Visit    Patient Name: Tom Miranda Date of Encounter: 07/17/2022  Primary Care Provider:  Alen Bleacher, MD Primary Cardiologist:  Kirk Ruths, MD  Chief Complaint    47 year old male with a history of chest pain, LV dysfunction, hyperlipidemia, and GERD who presents for follow-up related to chest pain, LV dysfunction.   Past Medical History    Past Medical History:  Diagnosis Date   Asthma    GERD (gastroesophageal reflux disease)    Hyperlipidemia    Post concussion syndrome    Past Surgical History:  Procedure Laterality Date   No prior surgery      Allergies  No Known Allergies   Labs/Other Studies Reviewed    The following studies were reviewed today: CCTA 02/2021:  IMPRESSION: 1.  Coronary calcium score of 0.   2.  Normal coronary origin with right dominance.   3. Poor quality study with significant step artifacts due to cardiac motion but no CAD seen   4. LV appears hypertrabeculated and mildly dilated, consider echocardiogram for further evaluation   CAD-RADS 0. No evidence of CAD (0%). Consider non-atherosclerotic causes of chest pain.   Echo 02/2021: IMPRESSIONS    1. Left ventricular ejection fraction, by estimation, is 50 to 55%. Left  ventricular ejection fraction by 3D volume is 52 %. The left ventricle has  low normal function. The left ventricle has no regional wall motion  abnormalities. Left ventricular  diastolic parameters were normal. The average left ventricular global  longitudinal strain is 17.4 %. The global longitudinal strain is normal.   2. Right ventricular systolic function is normal. The right ventricular  size is normal.   3. The mitral valve is normal in structure. Trivial mitral valve  regurgitation. No evidence of mitral stenosis.   4. The aortic valve is grossly normal. Aortic valve regurgitation is not  visualized. No aortic stenosis is present.    Echo 05/2022:   IMPRESSIONS    1. Left ventricular  ejection fraction, by estimation, is 40 to 45%. The  left ventricle has mildly decreased function. The left ventricle  demonstrates global hypokinesis. Left ventricular diastolic parameters are  indeterminate.   2. Right ventricular systolic function is normal. The right ventricular  size is normal. Tricuspid regurgitation signal is inadequate for assessing  PA pressure.   3. No evidence of mitral valve regurgitation.   4. Aortic valve regurgitation is not visualized.   5. The inferior vena cava is normal in size with greater than 50%  respiratory variability, suggesting right atrial pressure of 3 mmHg.   Conclusion(s)/Recommendation(s): EF more reduced compared to prior study.  Cannot rule out noncompaction, consider cardiac MRI.     Recent Labs: 04/01/2022: Hemoglobin 14.9; Platelets 225 05/31/2022: BUN 10; Creatinine, Ser 1.03; Magnesium 2.0; Potassium 4.2; Sodium 140; TSH 0.883  Recent Lipid Panel    Component Value Date/Time   CHOL 194 04/01/2022 1650   TRIG 215 (H) 04/01/2022 1650   HDL 42 04/01/2022 1650   CHOLHDL 4.6 04/01/2022 1650   LDLCALC 114 (H) 04/01/2022 1650   LDLDIRECT 163 (H) 02/15/2020 1642    History of Present Illness    47 year old male with the above past medical history including chest pain, LV dysfunction, hyperlipidemia, and GERD.   He was initially evaluated by Dr. Stanford Breed on 01/31/2021 in the setting of chest pain.  Coronary CTA revealed coronary calcium score of 0, no evidence of CAD.  Echocardiogram was normal. It was felt that his symptoms  were likely related to GERD.  He saw his PCP on 05/20/2022 with complaints of chest discomfort, described as a pressure, relieved with belching.  EKG showed sinus rhythm with PVCs.  Repeat echocardiogram showed EF 40 to 45%, mildly decreased LV function, LV global hypokinesis, indeterminate diastolic parameters, normal RV systolic function, no significant valvular abnormalities.  Cardiac MRI was recommended to rule out  noncompaction. He was last seen in the office on 05/30/2022 and was stable from a cardiac standpoint.  He noted some ongoing epigastric discomfort, similar to prior, relieved with palpation and belching.  I reviewed patient's echocardiogram, he felt there was no evidence of noncompaction, however, he agreed to proceed with cardiac MRI to rule out possible infiltrative process.  EKG showed frequent PVCs.  14-day ZIO revealed predominantly sinus rhythm, sinus bradycardia, sinus tachycardia, frequent PVCs (17.6% burden), ventricular bigeminy and trigeminy.    He presents today for follow-up. Since his last visit he has  Cardiac MRI pending. Start beta-blocker.  Repeat monitor. 1. Chest pain: Coronary CTA revealed coronary calcium score of 0, no evidence of CAD.  He has had recent epigastric discomfort similar to prior GERD, relieved with palpation and belching.  Denies any other symptoms concerning for angina.  cMRI pending as below.   2. LV dysfunction: Echo in 2022 was essentially normal.  Repeat echo in 05/2022 showed EF 40 to 45%, mildly decreased LV function, LV global hypokinesis, indeterminate diastolic parameters, normal RV systolic function, no significant valvular abnormalities.  Euvolemic and well compensated on exam.  Dr. Stanford Breed reviewed echocardiogram, who felt there was no evidence of noncompaction. However, he agreed to proceed with cardiac MRI to rule out possible infiltrative process. He did have frequent PVCs on EKG at today's visit.  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 Cardiac monitor pending as below.   3. Bradycardia/frequent PVCs: HR recorded as 45 bpm with pulse ox today.  However, EKG shows sinus rhythm with frequent PVCs (bigeminy, trigeminy, 96 bpm).  Patient is asymptomatic. However, this could explain his new cardiomyopathy.  Will obtain 14-day ZIO monitor.  We discussed possible initiation of beta-blocker, possible EP referral if he has high PVC burden.   Discussed ED precautions. Patient verbalized understanding. Will check BMET, TSH, Mg today.    4. Hyperlipidemia: LDL was 114 in 03/2022.  Continue ongoing lifestyle modifications with diet and exercise.   5. GERD: He has had some recent epigastric discomfort similar to prior GERD.  Follows with GI.    6. Disposition: Follow-up i  Home Medications    Current Outpatient Medications  Medication Sig Dispense Refill   [facility] MELATONIN 3 MG TABS tablet TAKE 1 TABLET BY MOUTH AT BEDTIME. 30 tablet 0   famotidine (PEPCID) 40 MG tablet Take 1 tablet (40 mg total) by mouth daily. 90 tablet 1   ibuprofen (ADVIL) 200 MG tablet Take 200 mg by mouth every 6 (six) hours as needed.     Magnesium Oxide 400 MG CAPS Take 1 capsule (400 mg total) by mouth daily as needed (can take daily for 3-4 days then daily as needed for headache). 30 capsule 0   methocarbamol (ROBAXIN) 500 MG tablet Take 1 tablet (500 mg total) by mouth at bedtime as needed for muscle spasms. 30 tablet 0   rizatriptan (MAXALT) 10 MG tablet Take 1 tablet (10 mg total) by mouth as needed for migraine. May repeat in 2 hours if needed 10 tablet 11   triamcinolone ointment (KENALOG) 0.5 % Apply 1  Application topically 2 (two) times daily. 60 g 3   No current facility-administered medications for this visit.     Review of Systems    ***.  All other systems reviewed and are otherwise negative except as noted above.    Physical Exam    VS:  There were no vitals taken for this visit. , BMI There is no height or weight on file to calculate BMI.     GEN: Well nourished, well developed, in no acute distress. HEENT: normal. Neck: Supple, no JVD, carotid bruits, or masses. Cardiac: RRR, no murmurs, rubs, or gallops. No clubbing, cyanosis, edema.  Radials/DP/PT 2+ and equal bilaterally.  Respiratory:  Respirations regular and unlabored, clear to auscultation bilaterally. GI: Soft, nontender, nondistended, BS + x 4. MS: no deformity or  atrophy. Skin: warm and dry, no rash. Neuro:  Strength and sensation are intact. Psych: Normal affect.  Accessory Clinical Findings    ECG personally reviewed by me today - *** - no acute changes.   Lab Results  Component Value Date   WBC 6.5 04/01/2022   HGB 14.9 04/01/2022   HCT 43.6 04/01/2022   MCV 87 04/01/2022   PLT 225 04/01/2022   Lab Results  Component Value Date   CREATININE 1.03 05/31/2022   BUN 10 05/31/2022   NA 140 05/31/2022   K 4.2 05/31/2022   CL 100 05/31/2022   CO2 23 05/31/2022   Lab Results  Component Value Date   ALT 22 02/07/2021   AST 26 02/07/2021   ALKPHOS 71 02/07/2021   BILITOT 0.5 02/07/2021   Lab Results  Component Value Date   CHOL 194 04/01/2022   HDL 42 04/01/2022   LDLCALC 114 (H) 04/01/2022   LDLDIRECT 163 (H) 02/15/2020   TRIG 215 (H) 04/01/2022   CHOLHDL 4.6 04/01/2022    Lab Results  Component Value Date   HGBA1C 6.2 04/01/2022    Assessment & Plan    1.  ***  No BP recorded.  {Refresh Note OR Click here to enter BP  :1}***   Lenna Sciara, NP 07/17/2022, 12:59 PM

## 2022-07-18 ENCOUNTER — Telehealth (HOSPITAL_COMMUNITY): Payer: Self-pay | Admitting: *Deleted

## 2022-07-18 ENCOUNTER — Telehealth: Payer: Self-pay | Admitting: Nurse Practitioner

## 2022-07-18 DIAGNOSIS — R079 Chest pain, unspecified: Secondary | ICD-10-CM

## 2022-07-18 MED ORDER — ONDANSETRON HCL 4 MG PO TABS: 4.0000 mg | ORAL_TABLET | Freq: Three times a day (TID) | ORAL | 0 refills | Status: DC | PRN

## 2022-07-18 NOTE — Progress Notes (Signed)
Error

## 2022-07-18 NOTE — Telephone Encounter (Signed)
Reaching out to patient to offer assistance regarding upcoming cardiac imaging study; pt verbalizes understanding of appt date/time, parking situation and where to check in,  and verified current allergies; name and call back number provided for further questions should they arise  Tom Clement RN Navigator Cardiac Imaging [facility] [phone] office [phone] cell  Patient denies claustrophobia or metal. States he threw up when he got MRI contrast. I reached out to ordering provider for some anti-nausea medication for the scan.

## 2022-07-18 NOTE — Telephone Encounter (Signed)
   Patient Name: Tom Miranda  DOB: [birth year] MRN: [id number]  Primary Cardiologist: Kirk Ruths, MD  Received a message from Curly [place], RN with coronary CT program.  Patient has a history of nausea and vomiting with IV contrast.  Prescription requested for Zofran to take prior to CT.  Will send prescription for Zofran  4 mg q8 hr prn to patient's pharmacy ([facility]  Lenna Sciara, NP 07/18/2022, 12:11 PM

## 2022-07-19 ENCOUNTER — Ambulatory Visit (HOSPITAL_COMMUNITY): Admission: RE | Admit: 2022-07-19 | Payer: Medicaid Other | Source: Ambulatory Visit

## 2022-08-16 ENCOUNTER — Ambulatory Visit (HOSPITAL_COMMUNITY)
Admission: RE | Admit: 2022-08-16 | Discharge: 2022-08-16 | Disposition: A | Discharge status: Home / Self Care | Payer: Medicaid Other | Source: Ambulatory Visit | Attending: Family Medicine | Admitting: Family Medicine

## 2022-08-16 ENCOUNTER — Ambulatory Visit: Payer: Medicaid Other | Admitting: Student

## 2022-08-16 ENCOUNTER — Encounter: Payer: Self-pay | Admitting: Student

## 2022-08-16 VITALS — BP 124/82 | HR 69 | Ht 68.0 in | Wt 221.0 lb

## 2022-08-16 DIAGNOSIS — R42 Dizziness and giddiness: Secondary | ICD-10-CM | POA: Diagnosis not present

## 2022-08-16 LAB — POCT HEMOGLOBIN: Hemoglobin: 14.9 g/dL — AB (ref 11–14.6)

## 2022-08-16 LAB — GLUCOSE, POCT (MANUAL RESULT ENTRY): POC Glucose: 102 mg/dl — AB (ref 70–99)

## 2022-08-16 NOTE — Patient Instructions (Signed)
It was great to see you! Thank you for allowing me to participate in your care!   Our plans for today:  -Your glucose and hemoglobin were normal -Your EKG is normal -We are getting some baseline labs for you -Increase fluids and I want you to eat more frequently -Return to care if having palpitations or chest pain -Follow up with cardiology  -I am giving a work note for today and tomorrow to rest  Take care and seek immediate care sooner if you develop any concerns.  Levin Erp, MD

## 2022-08-16 NOTE — Assessment & Plan Note (Signed)
Orthostatic vitals within normal limits.  Normal glucose and point-of-care hemoglobin.  EKG without high PVC burden although LTM showed 17% burden.  Patient's pulse was in the 50s while laying down and EKG with heart rate of 60.  Opted not to start beta-blocker today due to this.  He has cardiology follow-up for this.  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  The patient has been very stressed with work and has worked 15 days straight and has had gotten little sleep during this time. -Increase fluid intake -Eating more frequently (only eats 1 meal a day) -Work note to be out today and through the weekend - CBC - Basic Metabolic Panel  -ED/Return precautions discussed

## 2022-08-16 NOTE — Progress Notes (Signed)
    SUBJECTIVE:   CHIEF COMPLAINT / HPI: Fatigue and lightheadedness  Patient says that this morning he has not been feeling well.  He has a slight headache, mild lower back pain and overall feels very fatigued.  He says that when he is standing up or walking he starts to feel very lightheaded.  States that he has not been drinking very much water.  Last time he drink was this morning about a cup of water.  He only eats 1 meal a day because he is working so much and he said the last time that he ate was yesterday around noon.  He has been working very hard at his job and has been working 15 days in a row straight. He has very long shifts daily.   He had a slight cough this morning but it has gone away.  Denies any nasal congestion or fevers.  No vomiting or diarrhea.  No one around him that he knows he was sick.  No chest pain or palpitations Recent long-term monitor with frequent PVCs 17.6% burden.  PERTINENT  PMH / PSH: GERD, obesity, history of postconcussion headache syndrome  OBJECTIVE:   BP 124/82   Pulse 69   Ht 5\' 8"  (1.727 m)   Wt 221 lb (100.2 kg)   SpO2 100%   BMI 33.60 kg/m    General: NAD, awake, alert, responsive to questions Head: Normocephalic atraumatic CV: Regular rate and rhythm no murmurs rubs or gallops Respiratory: Clear to ausculation bilaterally, no wheezes rales or crackles, chest rises symmetrically,  no increased work of breathing Extremities: Moves upper and lower extremities freely, no edema in LE Neuro: No focal deficits Skin: No rashes or lesions visualized   Orthostatic vitals (Negative) Laying  BP 121/46 HR 55 O2 100  Sitting BP 125/57 HR 54 O2 99  Standing  BP 110/88 HR 71 O2 100  POC Glucose 102 POC Hgb 14.9    ASSESSMENT/PLAN:   Orthostatic lightheadedness Orthostatic vitals within normal limits.  Normal glucose and point-of-care hemoglobin.  EKG without high PVC burden although LTM showed 17% burden.  Patient's pulse was in  the 50s while laying down and EKG with heart rate of 60.  Opted not to start beta-blocker today due to this.  He has cardiology follow-up for this.  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  The patient has been very stressed with work and has worked 15 days straight and has had gotten little sleep during this time. -Increase fluid intake -Eating more frequently (only eats 1 meal a day) -Work note to be out today and through the weekend - CBC - Basic Metabolic Panel  -ED/Return precautions discussed   Levin Erp, MD [facility]

## 2022-09-21 ENCOUNTER — Encounter (HOSPITAL_COMMUNITY): Payer: Self-pay

## 2022-09-21 ENCOUNTER — Ambulatory Visit (HOSPITAL_COMMUNITY)
Admission: RE | Admit: 2022-09-21 | Discharge: 2022-09-21 | Disposition: A | Discharge status: Home / Self Care | Payer: Medicaid Other | Source: Ambulatory Visit | Attending: Internal Medicine | Admitting: Internal Medicine

## 2022-09-21 VITALS — BP 109/66 | HR 63 | Temp 98.3°F | Resp 14

## 2022-09-21 DIAGNOSIS — M26609 Unspecified temporomandibular joint disorder, unspecified side: Secondary | ICD-10-CM

## 2022-09-21 DIAGNOSIS — H9202 Otalgia, left ear: Secondary | ICD-10-CM

## 2022-09-21 MED ORDER — CYCLOBENZAPRINE HCL 10 MG PO TABS: 10.0000 mg | ORAL_TABLET | Freq: Two times a day (BID) | ORAL | 0 refills | Status: DC | PRN

## 2022-09-21 NOTE — Discharge Instructions (Addendum)
Your symptoms are likely due to dysfunction of your temporomandibular joint.  You may take Flexeril muscle relaxer every 12 hours as needed for ear pain associated with chewing and opening and closing your jaw. Apply heat to the area to relieve pressure 20 minutes on 20 minutes off.  You may also take Zyrtec 10 mg once nightly to help dry up fluid behind the ears contributing to discomfort.  Eat foods that are easy to chew and are soft.  Muscle relaxer may make you drowsy, so you only take this at bedtime.  If you develop any new or worsening symptoms or do not improve in the next 2 to 3 days, please return.  If your symptoms are severe, please go to the emergency room.  Follow-up with your primary care provider for further evaluation and management of your symptoms as well as ongoing wellness visits.  I hope you feel better!

## 2022-09-21 NOTE — ED Triage Notes (Signed)
Pt c/o left ear pain for about 3 days.

## 2022-09-21 NOTE — ED Provider Notes (Signed)
[facility]    CSN: [id number] Arrival date & time: 09/21/22  1624      History   Chief Complaint Chief Complaint  Patient presents with   Ear Pain    HPI Tom Miranda is a 47 y.o. male.   Patient presents to urgent care for evaluation of left-sided ear pain that started approximately 3 days ago.  He notices that the ear pain is worse when he chews his food and he has noticed some clicking to the left jaw.  No recent injuries or trauma to the head/jaw/ear.  No drainage from the left ear, decreased hearing, or tinnitus.  No pain to the right ear.  Denies sore throat, cough, fever, chills, nausea, vomiting, dizziness, diarrhea, rash, or recent antibiotic/steroid use.  No history of TMJ dysfunction.  He states left ear pain feels like a pressure throbbing sensation sometimes and is intermittent.  Denies dental pain.  He has not attempted use of any over-the-counter medications to help with pain or symptoms before coming to urgent care.     Past Medical History:  Diagnosis Date   Asthma    GERD (gastroesophageal reflux disease)    Hyperlipidemia    Post concussion syndrome     Patient Active Problem List   Diagnosis Date Noted   Orthostatic lightheadedness 08/16/2022   No-show for appointment 05/14/2021   Concussion with no loss of consciousness 10/20/2020   Post-concussion headache 10/20/2020   Rash and nonspecific skin eruption 07/25/2020   Prediabetes 02/16/2020   Hyperlipidemia 02/16/2020   GERD (gastroesophageal reflux disease)    Chest pain 08/18/2019   Obesity (BMI 30.0-34.9) 06/02/2019   Glaucoma 06/02/2019    Past Surgical History:  Procedure Laterality Date   No prior surgery         Home Medications    Prior to Admission medications   Medication Sig Start Date End Date Taking? Authorizing Provider  cyclobenzaprine (FLEXERIL) 10 MG tablet Take 1 tablet (10 mg total) by mouth 2 (two) times daily as needed for muscle spasms. 09/21/22  Yes  Carlisle Beers, FNP  [facility] MELATONIN 3 MG TABS tablet TAKE 1 TABLET BY MOUTH AT BEDTIME. 11/13/20   Cresenzo, Cyndi Lennert, MD  famotidine (PEPCID) 40 MG tablet Take 1 tablet (40 mg total) by mouth daily. 05/20/22   Alfredo Martinez, MD  ibuprofen (ADVIL) 200 MG tablet Take 200 mg by mouth every 6 (six) hours as needed.    [provider]  Magnesium Oxide 400 MG CAPS Take 1 capsule (400 mg total) by mouth daily as needed (can take daily for 3-4 days then daily as needed for headache). 10/19/20   Autry-Lott, Randa Evens, DO  ondansetron (ZOFRAN) 4 MG tablet Take 1 tablet (4 mg total) by mouth every 8 (eight) hours as needed for nausea or vomiting. 07/18/22   Joylene Grapes, NP  rizatriptan (MAXALT) 10 MG tablet Take 1 tablet (10 mg total) by mouth as needed for migraine. May repeat in 2 hours if needed 06/25/22   Lomax, Amy, NP  triamcinolone ointment (KENALOG) 0.5 % Apply 1 Application topically 2 (two) times daily. 11/26/21   Shelby Mattocks, DO    Family History Family History  Problem Relation Age of Onset   Glaucoma Mother     Social History Social History   Tobacco Use   Smoking status: Former    Types: Cigarettes    Passive exposure: Past   Smokeless tobacco: Never  Substance Use Topics   Alcohol use:  Yes    Comment: Occasional   Drug use: No     Allergies   Patient has no known allergies.   Review of Systems Review of Systems Per HPI  Physical Exam Triage Vital Signs ED Triage Vitals  Enc Vitals Group     BP 09/21/22 1632 109/66     Pulse Rate 09/21/22 1632 63     Resp 09/21/22 1632 14     Temp 09/21/22 1632 98.3 F (36.8 C)     Temp Source 09/21/22 1632 Oral     SpO2 09/21/22 1632 96 %     Weight --      Height --      Head Circumference --      Peak Flow --      Pain Score 09/21/22 1631 7     Pain Loc --      Pain Edu? --      Excl. in GC? --    No data found.  Updated Vital Signs BP 109/66 (BP Location: Left Arm)   Pulse 63   Temp 98.3 F (36.8 C)  (Oral)   Resp 14   SpO2 96%   Visual Acuity Right Eye Distance:   Left Eye Distance:   Bilateral Distance:    Right Eye Near:   Left Eye Near:    Bilateral Near:     Physical Exam Vitals and nursing note reviewed.  Constitutional:      Appearance: He is not ill-appearing or toxic-appearing.  HENT:     Head: Normocephalic and atraumatic.     Jaw: There is normal jaw occlusion. Tenderness and pain on movement present. No trismus.     Comments: TMJ dysfunction/click to palpation to the left TMJ.    Right Ear: Hearing, tympanic membrane, ear canal and external ear normal.     Left Ear: Hearing, tympanic membrane, ear canal and external ear normal.     Nose: Nose normal.     Mouth/Throat:     Lips: Pink.     Mouth: Mucous membranes are moist. No injury.     Tongue: No lesions. Tongue does not deviate from midline.     Palate: No mass and lesions.     Pharynx: Oropharynx is clear. Uvula midline. No pharyngeal swelling, oropharyngeal exudate, posterior oropharyngeal erythema or uvula swelling.     Tonsils: No tonsillar exudate or tonsillar abscesses.  Eyes:     General: Lids are normal. Vision grossly intact. Gaze aligned appropriately.     Extraocular Movements: Extraocular movements intact.     Conjunctiva/sclera: Conjunctivae normal.  Cardiovascular:     Rate and Rhythm: Normal rate and regular rhythm.     Heart sounds: Normal heart sounds, S1 normal and S2 normal.  Pulmonary:     Effort: Pulmonary effort is normal. No respiratory distress.     Breath sounds: Normal breath sounds and air entry.  Musculoskeletal:     Cervical back: Neck supple.  Lymphadenopathy:     Cervical: No cervical adenopathy.  Skin:    General: Skin is warm and dry.     Capillary Refill: Capillary refill takes less than 2 seconds.     Findings: No rash.  Neurological:     General: No focal deficit present.     Mental Status: He is alert and oriented to person, place, and time. Mental status is at  baseline.     Cranial Nerves: No dysarthria or facial asymmetry.  Psychiatric:  Mood and Affect: Mood normal.        Speech: Speech normal.        Behavior: Behavior normal.        Thought Content: Thought content normal.        Judgment: Judgment normal.      UC Treatments / Results  Labs (all labs ordered are listed, but only abnormal results are displayed) Labs Reviewed - No data to display  EKG   Radiology No results found.  Procedures Procedures (including critical care time)  Medications Ordered in UC Medications - No data to display  Initial Impression / Assessment and Plan / UC Course  I have reviewed the triage vital signs and the nursing notes.  Pertinent labs & imaging results that were available during my care of the patient were reviewed by me and considered in my medical decision making (see chart for details).   1.  Left ear pain, TMJ dysfunction Presentation is consistent with TMJ dysfunction versus middle ear effusion.  No signs of AOM/otitis externa.  Overall well-appearing with hemodynamically stable vital signs and afebrile.  May use Flexeril every 12 hours as needed for pain associated with TMJ dysfunction.  Drowsiness precautions discussed regarding Flexeril use.  Heat therapy recommended to the left jaw to reduce muscle spasm. Zyrtec 10 mg may be used at bedtime to help relieve some of the pressure to the left ear as well.  Recommend PCP follow-up in the next 1 to 2 weeks as needed.  Discussed physical exam and available lab work findings in clinic with patient.  Counseled patient regarding appropriate use of medications and potential side effects for all medications recommended or prescribed today. Discussed red flag signs and symptoms of worsening condition,when to call the PCP office, return to urgent care, and when to seek higher level of care in the emergency department. Patient verbalizes understanding and agreement with plan. All questions  answered. Patient discharged in stable condition.    Final Clinical Impressions(s) / UC Diagnoses   Final diagnoses:  Left ear pain  TMJ dysfunction     Discharge Instructions      Your symptoms are likely due to dysfunction of your temporomandibular joint.  You may take Flexeril muscle relaxer every 12 hours as needed for ear pain associated with chewing and opening and closing your jaw. Apply heat to the area to relieve pressure 20 minutes on 20 minutes off.  You may also take Zyrtec 10 mg once nightly to help dry up fluid behind the ears contributing to discomfort.  Eat foods that are easy to chew and are soft.  Muscle relaxer may make you drowsy, so you only take this at bedtime.  If you develop any new or worsening symptoms or do not improve in the next 2 to 3 days, please return.  If your symptoms are severe, please go to the emergency room.  Follow-up with your primary care provider for further evaluation and management of your symptoms as well as ongoing wellness visits.  I hope you feel better!     ED Prescriptions     Medication Sig Dispense Auth. Provider   cyclobenzaprine (FLEXERIL) 10 MG tablet Take 1 tablet (10 mg total) by mouth 2 (two) times daily as needed for muscle spasms. 20 tablet Carlisle Beers, FNP      PDMP not reviewed this encounter.   Carlisle Beers, [place] 09/21/22 1711

## 2022-10-02 ENCOUNTER — Ambulatory Visit: Payer: Medicaid Other | Admitting: Family Medicine

## 2022-10-02 VITALS — BP 109/71 | HR 88 | Ht 68.0 in | Wt 222.5 lb

## 2022-10-02 DIAGNOSIS — H9202 Otalgia, left ear: Secondary | ICD-10-CM | POA: Diagnosis not present

## 2022-10-02 NOTE — Progress Notes (Signed)
    SUBJECTIVE:   CHIEF COMPLAINT / HPI:   Left Ear Pain Present for 2 weeks. Comes and goes. Unchanged in severity from when it started. Unable to pinpoint any specific aggravating factors. No changes in his hearing. No fever, no congestion, very slight cough, no headache, no throat pain, no dizziness. Using OTC ear drops, unsure of name. Also taking Ibuprofen every 6 hours which is helpful.  Went to Urgent Care for this on 09/21/22. Was diagnosed with TMJ dysfunction vs middle ear effusion. They advised Flexeril and Zyrtec. Tried 2 doses of Flexeril- no improvement so he stopped.  Never smoker.  PERTINENT  PMH / PSH: prediabetes, HTN  OBJECTIVE:   BP 109/71   Pulse 88   Ht 5\' 8"  (1.727 m)   Wt 222 lb 8 oz (100.9 kg)   SpO2 99%   BMI 33.83 kg/m   Gen: alert, well-appearing, NAD Head: Lane/AT, no TMJ tenderness or crepitus Eyes: normal sclera and conjunctiva, PERRL Ears: external ears, canals, and TMs normal bilaterally Nose: nares patent, no appreciable turbinate hypertrophy Throat: oropharynx unremarkable without tonsillar edema, erythema or exudate Neck: no cervical or supraclavicular lymphadenopathy CV: RRR, normal S1/S2 without m/r/g Resp: normal effort, lungs CTAB  Skin: no rashes on exposed skin Neuro: grossly intact   ASSESSMENT/PLAN:   Otalgia of left ear Intermittent x2 weeks. Ear exam unremarkable. No hearing changes. Possible TMJ although no evidence of this on exam. Reassurance provided. Continue Ibuprofen prn. Return if persistent or worsening.    Maury Dus, MD [facility]

## 2022-10-02 NOTE — Patient Instructions (Addendum)
It was great to meet you!  Your ear exam was normal today. This could be related to TMJ (jaw dysfunction) or even referred pain from somewhere else (throat, tooth etc)  I would continue taking Ibuprofen as needed.   Let us know if you develop new concerns or if the pain does not go away.   -Dr Anner Crete

## 2022-10-03 DIAGNOSIS — H9202 Otalgia, left ear: Secondary | ICD-10-CM | POA: Insufficient documentation

## 2022-10-03 NOTE — Assessment & Plan Note (Signed)
Intermittent x2 weeks. Ear exam unremarkable. No hearing changes. Possible TMJ although no evidence of this on exam. Reassurance provided. Continue Ibuprofen prn. Return if persistent or worsening.

## 2022-10-08 ENCOUNTER — Encounter (HOSPITAL_COMMUNITY): Payer: Self-pay

## 2022-10-09 ENCOUNTER — Telehealth (HOSPITAL_COMMUNITY): Payer: Self-pay | Admitting: Emergency Medicine

## 2022-10-09 NOTE — Telephone Encounter (Signed)
Attempted to call patient regarding upcoming cardiac CMRappointment. Left message on voicemail with name and callback number Nathanyal Ashmead RN Navigator Cardiac Imaging [facility] [phone] Office [phone] Cell    

## 2022-10-10 ENCOUNTER — Ambulatory Visit (HOSPITAL_COMMUNITY): Admission: RE | Admit: 2022-10-10 | Payer: Medicaid Other | Source: Ambulatory Visit

## 2022-11-04 ENCOUNTER — Encounter: Payer: Self-pay | Admitting: Family Medicine

## 2022-11-04 ENCOUNTER — Ambulatory Visit (INDEPENDENT_AMBULATORY_CARE_PROVIDER_SITE_OTHER): Payer: Medicaid Other | Admitting: Family Medicine

## 2022-11-04 VITALS — BP 132/78 | HR 65 | Ht 68.0 in | Wt 223.8 lb

## 2022-11-04 DIAGNOSIS — G8929 Other chronic pain: Secondary | ICD-10-CM

## 2022-11-04 DIAGNOSIS — M545 Low back pain, unspecified: Secondary | ICD-10-CM | POA: Diagnosis not present

## 2022-11-04 DIAGNOSIS — R7303 Prediabetes: Secondary | ICD-10-CM | POA: Diagnosis not present

## 2022-11-04 DIAGNOSIS — M549 Dorsalgia, unspecified: Secondary | ICD-10-CM | POA: Insufficient documentation

## 2022-11-04 NOTE — Progress Notes (Signed)
    SUBJECTIVE:   CHIEF COMPLAINT / HPI:   Bilateral back and flank pain: For 4-5 years now, patient has been experiencing bilateral back and flank pain each morning upon waking. With the pain, he also experiences shortness of breath. Usually, he will stretch and take some deep breaths, which relieves the pain. The pain is better throughout the rest of the day. No recent heavy lifting or injury. No radiating pains, no incontinence, no IV drug use, no recent MVA, no hx of spinal injections, no fevers or recent illness.   Prediabetes: Patient's HgA1c was last checked in 03/2022  PERTINENT  PMH / PSH: Prediabetes  OBJECTIVE:   BP 132/78   Pulse 65   Ht 5\' 8"  (1.727 m)   Wt 223 lb 12.8 oz (101.5 kg)   SpO2 100%   BMI 34.03 kg/m    General: Well-appearing, resting comfortably  CV: Normal S1/S2. No extra heart sounds. Warm and well-perfused. Pulm: Some crackles in LUL. Otherwise clear to auscultation. No increased WOB.  MSK: No tenderness to palpation of lower and mid back. No tenderness to palpation of spinous processes.  GU: No CVA tenderness Skin: Warm, dry Psych: Alert and oriented. 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 No current red flag symptoms of back pain.  - Discussed OTC pain meds as needed and to continue stretching - Discussed PT referral for further exercise guidance  Prediabetes - HgA1c check today   Tom Quale, MD [facility]

## 2022-11-04 NOTE — Patient Instructions (Addendum)
Thank you for coming in today- it was great seeing you!  Today we discussed your long-term back and side pain. Given the location and the relief with stretching, it seems this pain may be due to arthritis in your back. For this pain, we recommend you to continue your stretching and to use over the counter pain meds as needed. We have also placed a referral for physical therapy where you can learn more exercises to help with the pain.   Additionally, we will check your Hemoglobin A1c in lab today to follow up on prediabetes.   Please reach out to our clinic if you have any questions or concerns. Thanks again!

## 2022-11-05 LAB — HEMOGLOBIN A1C
Est. average glucose Bld gHb Est-mCnc: 137 mg/dL
Hgb A1c MFr Bld: 6.4 % — ABNORMAL HIGH (ref 4.8–5.6)

## 2022-11-06 ENCOUNTER — Ambulatory Visit: Payer: Medicaid Other | Admitting: Family Medicine

## 2022-11-07 IMAGING — MR MR HEAD W/O CM
7 series · 48 of 48 positions shown · non-contrast
Comparison: None.

CLINICAL DATA: Left-sided weakness and memory loss.  Numbness.

EXAM:
MRI HEAD WITHOUT CONTRAST
TECHNIQUE: Multiplanar, multiecho pulse sequences of the brain and surrounding
structures were obtained without intravenous contrast.

[Series 2: T1 · sagittal · 5.0mm · 0.45mm/px · 4 of 21 slices shown]
[im 1/21]
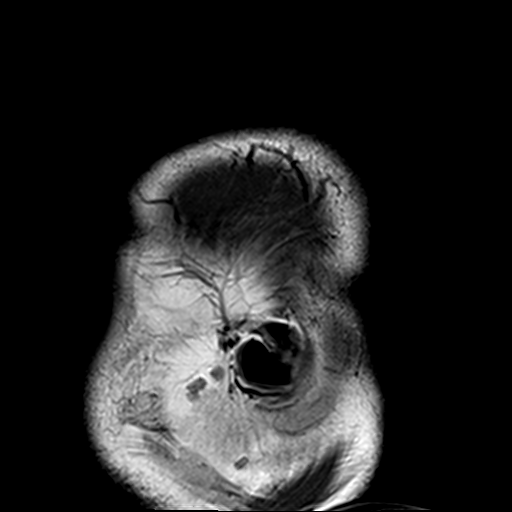
[im 7/21]
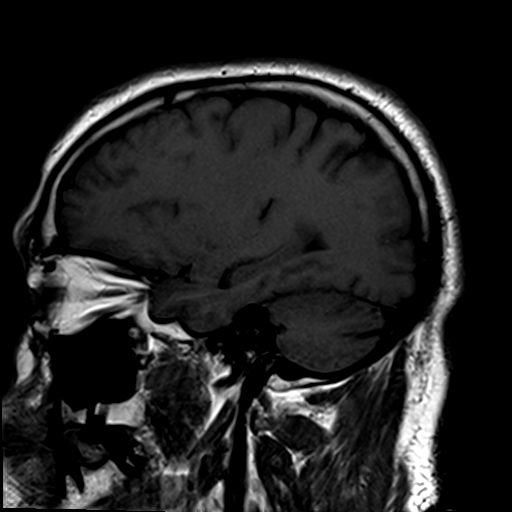
[im 14/21]
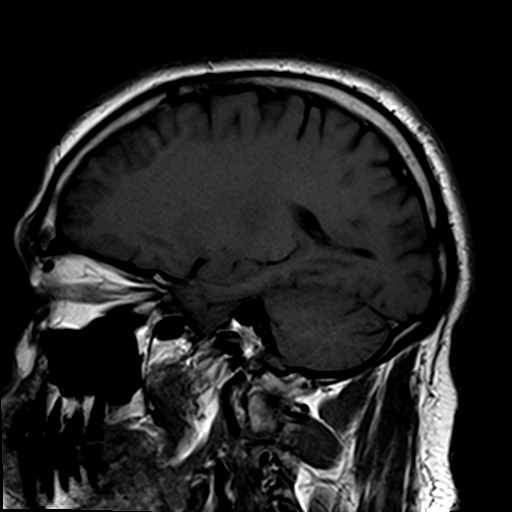
[im 21/21]
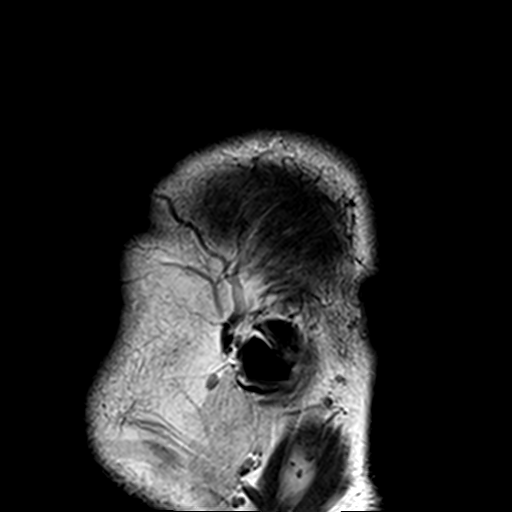

[Series 3: DWI · axial · 3.0mm · 1.80mm/px · z∈[-50,+97]mm · 15 of 100 slices shown (1 of 4)]
[im 1/100]
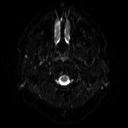
[im 8/100]
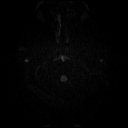
[im 15/100]
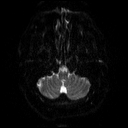
[im 22/100]
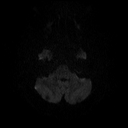
[im 29/100]
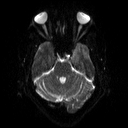
[im 36/100]
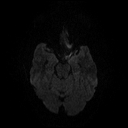
[im 43/100]
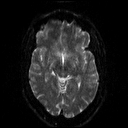
[im 50/100]
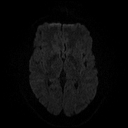
[im 57/100]
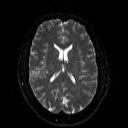
[im 64/100]
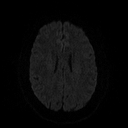
[im 71/100]
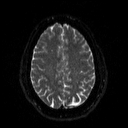
[im 78/100]
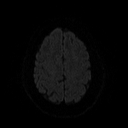
[im 85/100]
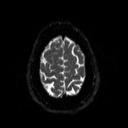
[im 92/100]
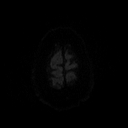
[im 100/100]
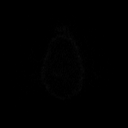

[Series 4: DWI · axial · 3.0mm · 1.80mm/px · z∈[-50,+97]mm · 7 of 49 slices shown (2 of 4)]
[im 1/49]
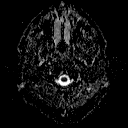
[im 9/49]
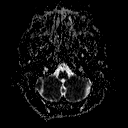
[im 17/49]
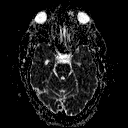
[im 25/49]
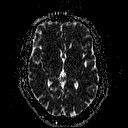
[im 33/49]
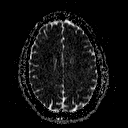
[im 41/49]
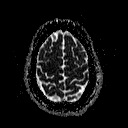
[im 49/49]
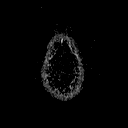

[Series 5: DWI · coronal · 5.0mm · 1.80mm/px · 10 of 71 slices shown (3 of 4)]
[im 1/71]
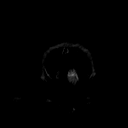
[im 8/71]
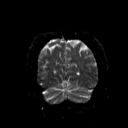
[im 16/71]
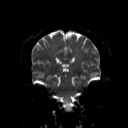
[im 24/71]
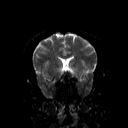
[im 32/71]
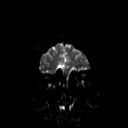
[im 39/71]
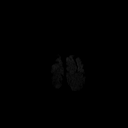
[im 47/71]
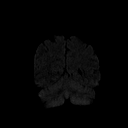
[im 55/71]
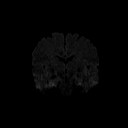
[im 63/71]
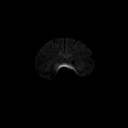
[im 71/71]
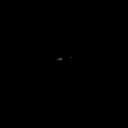

[Series 6: DWI · coronal · 5.0mm · 1.80mm/px · 5 of 36 slices shown (4 of 4)]
[im 1/36]
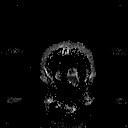
[im 9/36]
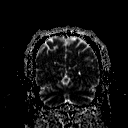
[im 18/36]
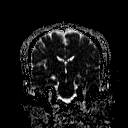
[im 27/36]
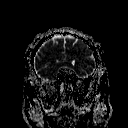
[im 36/36]
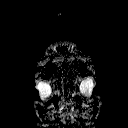

[Series 7: T2 · axial · 5.0mm · 0.51mm/px · z∈[-51,+96]mm · 3 of 22 slices shown]
[im 1/22]
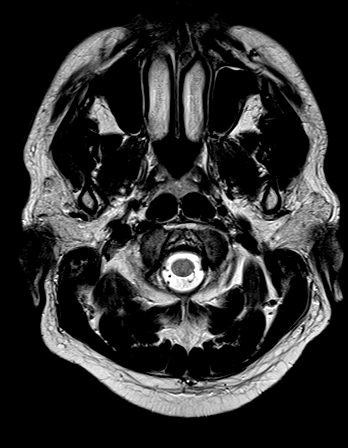
[im 11/22]
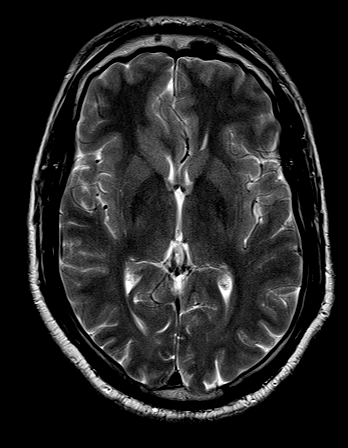
[im 22/22]
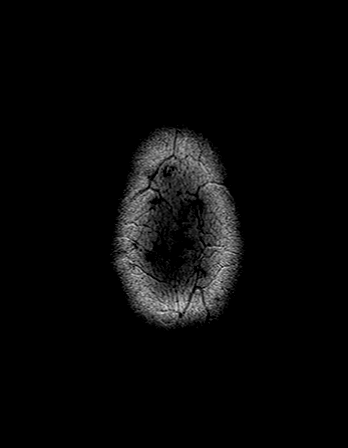

[Series 8: FLAIR · axial · 3.0mm · 0.45mm/px · z∈[-46,+89]mm · 4 of 30 slices shown]
[im 1/30]
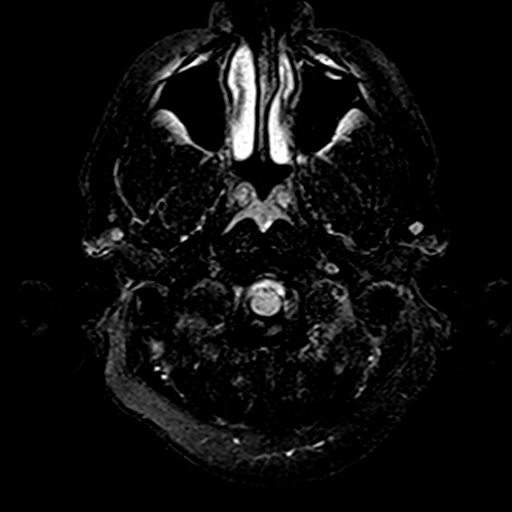
[im 10/30]
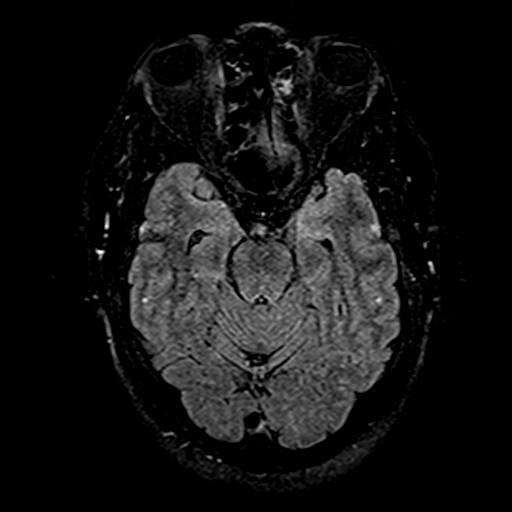
[im 20/30]
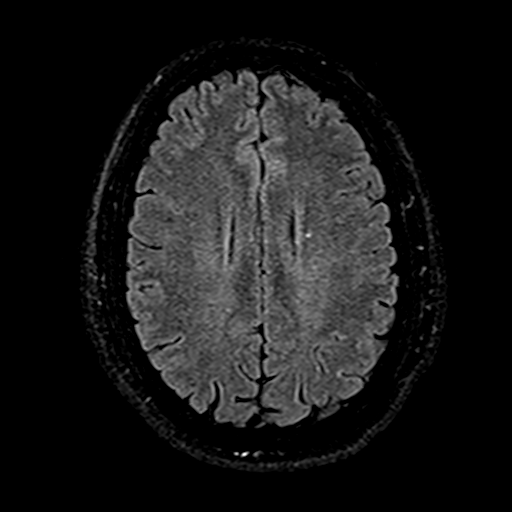
[im 30/30]
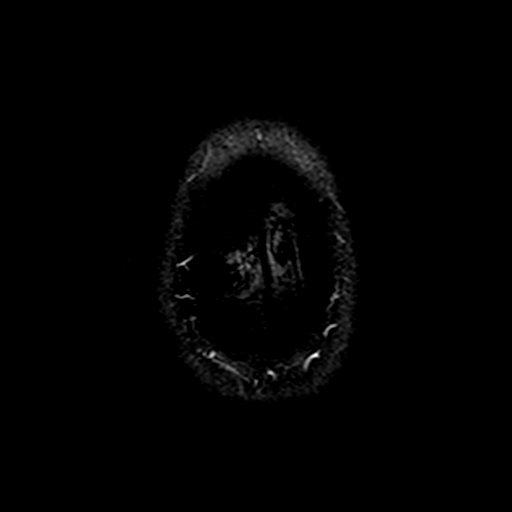

[48 of 48 positions shown; findings below may reference images not displayed]

FINDINGS: Brain: No acute infarct, mass effect or extra-axial collection. No
acute or chronic hemorrhage. Minimal multifocal hyperintense
T2-weight signal within the white matter. The midline structures are
normal.

Vascular: Major flow voids are preserved.

Skull and upper cervical spine: Normal calvarium and skull base.
Visualized upper cervical spine and soft tissues are normal.

Sinuses/Orbits:No paranasal sinus fluid levels or advanced mucosal
thickening. No mastoid or middle ear effusion. Normal orbits.
IMPRESSION: 1. Normal brain MRI.

## 2022-11-14 ENCOUNTER — Encounter: Payer: Self-pay | Admitting: Family Medicine

## 2022-12-26 ENCOUNTER — Ambulatory Visit: Payer: Medicaid Other | Admitting: Psychiatry

## 2022-12-26 ENCOUNTER — Encounter: Payer: Self-pay | Admitting: Psychiatry

## 2022-12-26 NOTE — Progress Notes (Deleted)
   CC:  post-traumatic headaches  Follow-up Visit  Last visit: 06/25/22  Brief HPI: 47 year old male with a history of glaucoma, HLD, prediabetes and GERD who follows in clinic for headaches and cognitive changes following a concussion in June 2022. MRI brain 03/01/21 was unremarkable.  Interval History: ***   Medications tried and failed: gabapentin, rizatriptan, methocarbamol   Physical Exam:   Vital Signs: There were no vitals taken for this visit. GENERAL:  well appearing, in no acute distress, alert  SKIN:  Color, texture, turgor normal. No rashes or lesions HEAD:  Normocephalic/atraumatic. RESP: normal respiratory effort MSK:  No gross joint deformities.   NEUROLOGICAL: Mental Status: Alert, oriented to person, place and time, Follows commands, and Speech fluent and appropriate. Cranial Nerves: PERRL, face symmetric, no dysarthria, hearing grossly intact Motor: moves all extremities equally Gait: normal-based.  IMPRESSION: ***  PLAN: ***   Follow-up: ***  I spent a total of *** minutes on the date of the service. Discussed medication side effects, adverse reactions and drug interactions. Written educational materials and patient instructions outlining all of the above were given.  Ocie Doyne, MD

## 2023-01-01 ENCOUNTER — Ambulatory Visit: Payer: Medicaid Other | Admitting: Family Medicine

## 2023-01-29 ENCOUNTER — Ambulatory Visit: Payer: Medicaid Other | Admitting: Student

## 2023-01-29 VITALS — BP 108/57 | HR 72 | Ht 68.0 in | Wt 221.8 lb

## 2023-01-29 DIAGNOSIS — M7989 Other specified soft tissue disorders: Secondary | ICD-10-CM

## 2023-01-29 DIAGNOSIS — J069 Acute upper respiratory infection, unspecified: Secondary | ICD-10-CM | POA: Diagnosis not present

## 2023-01-29 MED ORDER — NAPROXEN 500 MG PO TABS
500.0000 mg | ORAL_TABLET | Freq: Two times a day (BID) | ORAL | 0 refills | Status: AC
Start: 2023-01-29 — End: 2023-02-05

## 2023-01-29 MED ORDER — FLUTICASONE PROPIONATE 50 MCG/ACT NA SUSP
2.0000 | Freq: Every day | NASAL | 6 refills | Status: DC
Start: 2023-01-29 — End: 2023-10-27

## 2023-01-29 NOTE — Progress Notes (Signed)
    SUBJECTIVE:   CHIEF COMPLAINT / HPI: Finger pain and sick symptoms  2 days ago started having cough, congestion, tired, slight headache, body aches. Increasing water. No vomiting or diarrhea. Slight tactile fever yesterday where he felt warm. Taking alka seltzer tablets and home remedies cognac, pepper, turmeric, honey, black pepper. Girlfriend sick also. Not a smoker.    Handstand in pool in July and hurt his right index finger after this Marthe Patch a crack after that Unsure whether it was a flexion or extension injury Finger has been swelling  Picking things up make it hurt Feels a knot on middle side Swelling stayed the same Painful still Never got imaging  PERTINENT  PMH / PSH: GERD, obesity  OBJECTIVE:   BP (!) 108/57   Pulse 72   Ht 5\' 8"  (1.727 m)   Wt 221 lb 12.8 oz (100.6 kg)   SpO2 100%   BMI 33.72 kg/m   General: NAD, awake, alert, responsive to questions Head: Normocephalic atraumatic, nasal congestion present, mucous membranes, no exudates present on oropharynx CV: Regular rate and rhythm no murmurs rubs or gallops, no tender cervical adenopathy Respiratory: Clear to ausculation bilaterally, no wheezes rales or crackles, chest rises symmetrically,  no increased work of breathing Hand: Right index finger with swollen appearance compared to the left index finger, significant tenderness to palpation along PIP of index finger, mild tenderness of DIP, able to bend finger slightly but is painful with this, brisk capillary refill, 2+ radial pulses ASSESSMENT/PLAN:   Assessment & Plan Viral URI with cough Now on day 3 of illness.  Discussed symptomatic measures of hydration and nasal decongestant. -Flonase -ED/return precautions discussed Swelling of digit of right hand Right index finger swelling and pain mostly at PIP after injury in pool.  He is unsure whether related in extension or flexion injury but thinks that he heard a crack afterwards.  Never improved and here  today due to this. - RICE - naproxen (NAPROSYN) 500 MG tablet; Take 1 tablet (500 mg total) by mouth 2 (two) times daily with a meal for 7 days.  Dispense: 14 tablet; Refill: 0 - DG Hand Complete Right; Future      Levin Erp, MD [facility]

## 2023-01-29 NOTE — Patient Instructions (Signed)
It was great to see you! Thank you for allowing me to participate in your care!   Our plans for today:  - Please use flonase  - Please get xray of your hand and keep finger buddy taped when able, please ice the area, rest finger as much as possible, take the naproxen with meals for 7 days  Take care and seek immediate care sooner if you develop any concerns.  Levin Erp, MD

## 2023-01-31 ENCOUNTER — Ambulatory Visit (HOSPITAL_COMMUNITY)
Admission: RE | Admit: 2023-01-31 | Discharge: 2023-01-31 | Disposition: A | Discharge status: Home / Self Care | Payer: Medicaid Other | Source: Ambulatory Visit | Attending: Family Medicine | Admitting: Family Medicine

## 2023-01-31 DIAGNOSIS — M7989 Other specified soft tissue disorders: Secondary | ICD-10-CM | POA: Insufficient documentation

## 2023-01-31 DIAGNOSIS — M79644 Pain in right finger(s): Secondary | ICD-10-CM | POA: Diagnosis not present

## 2023-02-11 ENCOUNTER — Telehealth: Payer: Self-pay

## 2023-02-11 NOTE — Telephone Encounter (Signed)
  Medicaid Managed Care   Unsuccessful Outreach Note  02/11/2023 Name: Tom Miranda MRN: [id number] DOB: Jan 26, 1976  Referred by: Jerre Simon, MD Reason for referral : No chief complaint on file.   An unsuccessful telephone outreach was attempted today. The patient was referred to the case management team for assistance with care management and care coordination.   Follow Up Plan: If patient returns call to provider office, please advise to call Embedded Care Management Care Guide Nicholes Rough* at [phone]*  Nicholes Rough, CMA Care Guide [employer]

## 2023-02-25 ENCOUNTER — Ambulatory Visit: Payer: Medicaid Other | Admitting: Family Medicine

## 2023-04-18 ENCOUNTER — Encounter: Payer: Self-pay | Admitting: Family Medicine

## 2023-04-18 ENCOUNTER — Ambulatory Visit (INDEPENDENT_AMBULATORY_CARE_PROVIDER_SITE_OTHER): Payer: Medicaid Other | Admitting: Family Medicine

## 2023-04-18 VITALS — BP 121/79 | HR 67 | Ht 68.0 in | Wt 220.6 lb

## 2023-04-18 DIAGNOSIS — M25531 Pain in right wrist: Secondary | ICD-10-CM | POA: Diagnosis not present

## 2023-04-18 MED ORDER — MELOXICAM 15 MG PO TABS
15.0000 mg | ORAL_TABLET | Freq: Every day | ORAL | 0 refills | Status: DC
Start: 2023-04-18 — End: 2023-05-08

## 2023-04-18 NOTE — Patient Instructions (Signed)
 It was wonderful to see you today! Thank you for choosing [facility].   Please bring ALL of your medications with you to every visit.   Today we talked about:  I do see some swelling in your wrist after injury yesterday.  Please obtain the x-rays at [address] at your earliest convenience.  I do recommend using the meloxicam  daily for the next week to reduce inflammation.  You can use it another 1 to 2 weeks as needed for continued pain management.  Please do not take ibuprofen  while use this medication.  You can use Tylenol  for breakthrough pain as well.  I do recommend icing the area for 20 minutes 2-3 times per day. Please rest your wrist is much as possible in the next 2 weeks including avoiding lifting and repetitive movements at home and work.  For any concerns about your work note please contact our office and I am happy to provide clarification of them.  Please follow up in 2 weeks to discuss wrist pain  Call the clinic at [phone] if your symptoms worsen or you have any concerns.  Please be sure to schedule follow up at the front desk before you leave today.   Izetta Nap, DO Family Medicine

## 2023-04-18 NOTE — Progress Notes (Signed)
    SUBJECTIVE:   CHIEF COMPLAINT / HPI:   Right wrist pain Pain began after hitting the side of his right wrist on a solid surface while at work yesterday.  States he had immediate pain and notable swelling.  Reports he continued to work after the injury which made the pain worse.  Has been using a wrist brace but states that his wrist hurts really bad when he takes it off.  No prior injury or surgery to that wrist.  Pain is worse with wrist extension.  Works at [employer], heavy lifting while at work.  PERTINENT  PMH / PSH: GERD  OBJECTIVE:   BP 121/79   Pulse 67   Ht 5' 8 (1.727 m)   Wt 220 lb 9.6 oz (100.1 kg)   SpO2 100%   BMI 33.54 kg/m    General: NAD, pleasant, able to participate in exam MSK: Edema at lateral right wrist surface.  No surrounding erythema or deformity noted.  Pain limiting wrist extension.  Full flexion and lateral motion.  Full range of motion of all digits.  Sensation intact distally.  2+ radial pulse and good distal cap refill.  ASSESSMENT/PLAN:   Assessment & Plan Right wrist pain Work related injury, some edema at lateral wrist surface concerning for possible sprain vs fracture.  Will obtain imaging and continue supportive care.  Message patient about possible Worker's Comp. claim, if pursuing work-related injury will defer to [employer] for further management. -Right wrist XR -Meloxicam  15 mg daily x 2 weeks -Supportive care with icing and rest, provided light duty work note x 2 weeks.  Can continue to use wrist brace for symptomatic care as desired.   Dr. Izetta Nap, DO [facility]

## 2023-05-08 ENCOUNTER — Other Ambulatory Visit: Payer: Self-pay | Admitting: Family Medicine

## 2023-05-08 DIAGNOSIS — M25531 Pain in right wrist: Secondary | ICD-10-CM

## 2023-06-09 ENCOUNTER — Ambulatory Visit (INDEPENDENT_AMBULATORY_CARE_PROVIDER_SITE_OTHER): Payer: Medicaid Other | Admitting: Student

## 2023-06-09 ENCOUNTER — Encounter: Payer: Self-pay | Admitting: Student

## 2023-06-09 VITALS — BP 121/75 | HR 77 | Ht 68.0 in | Wt 219.8 lb

## 2023-06-09 DIAGNOSIS — M25531 Pain in right wrist: Secondary | ICD-10-CM

## 2023-06-09 NOTE — Progress Notes (Signed)
    SUBJECTIVE:   CHIEF COMPLAINT / HPI:   48 year old male presenting today for follow-up of wrist pain.  Injured wrist last July and imaging so far has been unremarkable.  Tom Miranda reinjured his wrist after hitting against the solid-phase at work.  Today patient reports improvement of pain since last visits and though not at baseline able to perform most activities with his hand without restrictions at this time.  No reported swelling, redness, fever or chills.   PERTINENT  PMH / PSH: Reviewed  OBJECTIVE:   BP 121/75 (Cuff Size: Large)   Pulse 77   Ht 5\' 8"  (1.727 m)   Wt 219 lb 12.8 oz (99.7 kg)   SpO2 99%   BMI 33.42 kg/m    Physical Exam General: Alert, well appearing, NAD Cardiovascular: RRR, No Murmurs, Normal S2/S2 Respiratory: CTAB, No wheezing or Rales Abdomen: No distension or tenderness Right wrist: No notable swelling, tenderness or deformity.  Good strength  ASSESSMENT/PLAN:   Right wrist pain. Improved and exam reassuring with no concerns at this time.  Provided patient with work note to return to work without any restrictions at this time.  Return precaution discussed with patient who verbalized understanding and agreeable to plan.   Jerre Simon, MD [facility]

## 2023-06-09 NOTE — Patient Instructions (Signed)
 Pleasure to meet you today.  Glad to hear that you have hand pain/wrist pain has since improved.  I am providing a letter to return to full duty starting today.  Near to do ibuprofen or Tylenol as needed for pain.

## 2023-06-18 ENCOUNTER — Ambulatory Visit (HOSPITAL_COMMUNITY)
Admission: EM | Admit: 2023-06-18 | Discharge: 2023-06-18 | Disposition: A | Discharge status: Home / Self Care | Attending: Physician Assistant | Admitting: Physician Assistant

## 2023-06-18 ENCOUNTER — Encounter (HOSPITAL_COMMUNITY): Payer: Self-pay

## 2023-06-18 DIAGNOSIS — K2100 Gastro-esophageal reflux disease with esophagitis, without bleeding: Secondary | ICD-10-CM | POA: Diagnosis not present

## 2023-06-18 MED ORDER — ALUM & MAG HYDROXIDE-SIMETH 200-200-20 MG/5ML PO SUSP
ORAL | Status: AC
  Filled 2023-06-18: qty 30

## 2023-06-18 MED ORDER — FAMOTIDINE 40 MG PO TABS: 40.0000 mg | ORAL_TABLET | Freq: Every day | ORAL | 0 refills | Status: DC

## 2023-06-18 MED ORDER — ALUM & MAG HYDROXIDE-SIMETH 200-200-20 MG/5ML PO SUSP
30.0000 mL | Freq: Once | ORAL | Status: AC
  Administered 2023-06-18: 30 mL via ORAL

## 2023-06-18 MED ORDER — SUCRALFATE 1 G PO TABS: 1.0000 g | ORAL_TABLET | Freq: Three times a day (TID) | ORAL | 0 refills | Status: DC

## 2023-06-18 NOTE — ED Triage Notes (Signed)
 Patient states he is out of his heartburn medication ( pepcid 40 mg) and has been out of it x 2 months. Patient states he has not needed it until now and states he has had heartburn x 5 hours.

## 2023-06-18 NOTE — Discharge Instructions (Signed)
 I am glad that you are feeling better after medication.  Start Pepcid at night.  Avoid NSAIDs including aspirin, ibuprofen/Advil, naproxen/Aleve.  Avoid alcohol.  Use Carafate before each meal for the next 5 days.  If you have any severe abdominal pain, nausea, vomiting, blood in your stool, dark stools you need to be seen immediately.

## 2023-06-18 NOTE — ED Provider Notes (Signed)
 [facility]    CSN: [id number] Arrival date & time: 06/18/23  1941      History   Chief Complaint Chief Complaint  Patient presents with   Heartburn   Medication Refill    HPI Tom Miranda is a 48 y.o. male.   Patient presents today with a 5 to 6-hour history of discomfort and increased belching.  He has a history of GERD with similar presentation.  He was previously prescribed famotidine but has not been taking this medication in several months.  He does report eating something unusual and believes that this triggered his symptoms.  He denies any nausea, vomiting, severe abdominal pain, melena, hematochezia, fever.  He has not tried any over-the-counter medication for symptom management.  He does not take NSAIDs regularly; does report he was taking them more frequently several weeks ago because of a wrist injury but has not taken them recently.  Denies any significant alcohol consumption.  He has never had an endoscopy and does not follow with GI.  He reports discomfort is rated 6 on a 0 to pain scale, described as a pressure in his abdomen, not relieved with belching.    Past Medical History:  Diagnosis Date   Asthma    GERD (gastroesophageal reflux disease)    Hyperlipidemia    Post concussion syndrome     Patient Active Problem List   Diagnosis Date Noted   Back pain 11/04/2022   Orthostatic lightheadedness 08/16/2022   Concussion with no loss of consciousness 10/20/2020   Post-concussion headache 10/20/2020   Rash and nonspecific skin eruption 07/25/2020   Prediabetes 02/16/2020   Hyperlipidemia 02/16/2020   GERD (gastroesophageal reflux disease)    Chest pain 08/18/2019   Obesity (BMI 30.0-34.9) 06/02/2019   Glaucoma 06/02/2019    Past Surgical History:  Procedure Laterality Date   No prior surgery         Home Medications    Prior to Admission medications   Medication Sig Start Date End Date Taking? Authorizing Provider  famotidine (PEPCID)  40 MG tablet Take 1 tablet (40 mg total) by mouth at bedtime. 06/18/23  Yes Nazanin Kinner K, PA-C  sucralfate (CARAFATE) 1 g tablet Take 1 tablet (1 g total) by mouth in the morning, at noon, and at bedtime. 06/18/23  Yes Jacilyn Sanpedro K, PA-C  [facility] MELATONIN 3 MG TABS tablet TAKE 1 TABLET BY MOUTH AT BEDTIME. 11/13/20   Cresenzo, Cyndi Lennert, MD  cyclobenzaprine (FLEXERIL) 10 MG tablet Take 1 tablet (10 mg total) by mouth 2 (two) times daily as needed for muscle spasms. 09/21/22   Carlisle Beers, FNP  fluticasone (FLONASE) 50 MCG/ACT nasal spray Place 2 sprays into both nostrils daily. 01/29/23   Levin Erp, MD  Magnesium Oxide 400 MG CAPS Take 1 capsule (400 mg total) by mouth daily as needed (can take daily for 3-4 days then daily as needed for headache). 10/19/20   Autry-Lott, Randa Evens, DO  ondansetron (ZOFRAN) 4 MG tablet Take 1 tablet (4 mg total) by mouth every 8 (eight) hours as needed for nausea or vomiting. 07/18/22   Joylene Grapes, NP  rizatriptan (MAXALT) 10 MG tablet Take 1 tablet (10 mg total) by mouth as needed for migraine. May repeat in 2 hours if needed 06/25/22   Lomax, Amy, NP  triamcinolone ointment (KENALOG) 0.5 % Apply 1 Application topically 2 (two) times daily. 11/26/21   Shelby Mattocks, DO    Family History Family History  Problem Relation Age  of Onset   Glaucoma Mother     Social History Social History   Tobacco Use   Smoking status: Former    Types: Cigarettes    Passive exposure: Past   Smokeless tobacco: Never  Vaping Use   Vaping status: Never Used  Substance Use Topics   Alcohol use: Yes    Comment: Occasional   Drug use: No     Allergies   Patient has no known allergies.   Review of Systems Review of Systems  Constitutional:  Positive for activity change. Negative for appetite change, fatigue and fever.  Respiratory:  Negative for cough and shortness of breath.   Cardiovascular:  Negative for chest pain.  Gastrointestinal:  Positive for abdominal  pain. Negative for blood in stool, diarrhea, nausea and vomiting.     Physical Exam Triage Vital Signs ED Triage Vitals  Encounter Vitals Group     BP 06/18/23 1947 124/79     Systolic BP Percentile --      Diastolic BP Percentile --      Pulse Rate 06/18/23 1947 82     Resp 06/18/23 1947 16     Temp 06/18/23 1947 98.6 F (37 C)     Temp Source 06/18/23 1947 Oral     SpO2 06/18/23 1947 96 %     Weight --      Height --      Head Circumference --      Peak Flow --      Pain Score 06/18/23 1949 6     Pain Loc --      Pain Education --      Exclude from Growth Chart --    No data found.  Updated Vital Signs BP 124/79 (BP Location: Right Arm)   Pulse 82   Temp 98.6 F (37 C) (Oral)   Resp 16   SpO2 96%   Visual Acuity Right Eye Distance:   Left Eye Distance:   Bilateral Distance:    Right Eye Near:   Left Eye Near:    Bilateral Near:     Physical Exam Vitals reviewed.  Constitutional:      General: He is awake.     Appearance: Normal appearance. He is well-developed. He is not ill-appearing.     Comments: Very pleasant male appears stated age in no acute distress sitting comfortably in exam room  HENT:     Head: Normocephalic and atraumatic.     Mouth/Throat:     Pharynx: Uvula midline. No oropharyngeal exudate or posterior oropharyngeal erythema.  Cardiovascular:     Rate and Rhythm: Normal rate and regular rhythm.     Heart sounds: Normal heart sounds, S1 normal and S2 normal. No murmur heard. Pulmonary:     Effort: Pulmonary effort is normal.     Breath sounds: Normal breath sounds. No stridor. No wheezing, rhonchi or rales.     Comments: Clear to auscultation bilaterally Abdominal:     General: Bowel sounds are normal.     Palpations: Abdomen is soft.     Tenderness: There is no abdominal tenderness. There is no right CVA tenderness, left CVA tenderness, guarding or rebound.     Comments: Benign abdominal exam  Neurological:     Mental Status: He is  alert.  Psychiatric:        Behavior: Behavior is cooperative.      UC Treatments / Results  Labs (all labs ordered are listed, but only abnormal results are displayed) Labs  Reviewed - No data to display  EKG   Radiology No results found.  Procedures Procedures (including critical care time)  Medications Ordered in UC Medications  alum & mag hydroxide-simeth (MAALOX/MYLANTA) 200-200-20 MG/5ML suspension 30 mL (30 mLs Oral Given 06/18/23 2015)    Initial Impression / Assessment and Plan / UC Course  I have reviewed the triage vital signs and the nursing notes.  Pertinent labs & imaging results that were available during my care of the patient were reviewed by me and considered in my medical decision making (see chart for details).     Patient is well-appearing, afebrile, nontoxic, nontachycardic.  Vital signs and physical exam are reassuring with no indication for emergent evaluation or imaging.  He was given Maalox in clinic with significant improvement of symptoms.  Suspect GERD flare as etiology of symptoms.  Will restart his famotidine daily but also add Carafate for the next 5 days.  He was encouraged to use dietary and lifestyle modification for additional symptom relief.  Discussed that if his symptoms recur or if anything worsens and he has severe abdominal pain, fever, nausea/vomiting interfering with oral intake, melena, hematochezia he needs to be seen emergently.  Strict return precautions given.  Excuse note provided.  Final Clinical Impressions(s) / UC Diagnoses   Final diagnoses:  Gastroesophageal reflux disease with esophagitis without hemorrhage     Discharge Instructions      I am glad that you are feeling better after medication.  Start Pepcid at night.  Avoid NSAIDs including aspirin, ibuprofen/Advil, naproxen/Aleve.  Avoid alcohol.  Use Carafate before each meal for the next 5 days.  If you have any severe abdominal pain, nausea, vomiting, blood in your  stool, dark stools you need to be seen immediately.    ED Prescriptions     Medication Sig Dispense Auth. Provider   famotidine (PEPCID) 40 MG tablet Take 1 tablet (40 mg total) by mouth at bedtime. 30 tablet Staley Lunz K, PA-C   sucralfate (CARAFATE) 1 g tablet Take 1 tablet (1 g total) by mouth in the morning, at noon, and at bedtime. 15 tablet Beckett Hickmon, Noberto Retort, PA-C      PDMP not reviewed this encounter.   Jeani Hawking, PA-C 06/18/23 2031

## 2023-10-27 ENCOUNTER — Ambulatory Visit (HOSPITAL_COMMUNITY)
Admission: EM | Admit: 2023-10-27 | Discharge: 2023-10-27 | Disposition: A | Discharge status: Home / Self Care | Attending: Internal Medicine | Admitting: Internal Medicine

## 2023-10-27 ENCOUNTER — Ambulatory Visit (INDEPENDENT_AMBULATORY_CARE_PROVIDER_SITE_OTHER)

## 2023-10-27 ENCOUNTER — Encounter (HOSPITAL_COMMUNITY): Payer: Self-pay

## 2023-10-27 DIAGNOSIS — S62657A Nondisplaced fracture of medial phalanx of left little finger, initial encounter for closed fracture: Secondary | ICD-10-CM

## 2023-10-27 DIAGNOSIS — S62627A Displaced fracture of medial phalanx of left little finger, initial encounter for closed fracture: Secondary | ICD-10-CM

## 2023-10-27 NOTE — ED Triage Notes (Signed)
 Pt states hurt his lt pinky finger while horse playing around on Friday. Took ibuprofen  with relief.

## 2023-10-27 NOTE — Discharge Instructions (Addendum)
 You broke your little finger.  Please wear the finger splint we have applied in the clinic today for the next 10 to 14 days to protect the finger from reinjury and to keep the bone aligned.  Take ibuprofen  600 mg every 6 hours as needed for pain and swelling.  Apply ice as needed to reduce swelling.  Please schedule a follow-up appointment with Dr. Alyse at [facility] as needed for new or worsening symptoms (numbness and tingling to the tip of the affected finger, temperature changes of the affected finger, etc.)  If you develop any new or worsening symptoms or if your symptoms do not start to improve, please return here or follow-up with your primary care provider. If your symptoms are severe, please go to the emergency room.

## 2023-10-27 NOTE — ED Provider Notes (Signed)
 [facility]    CSN: [id number] Arrival date & time: 10/27/23  1027      History   Chief Complaint Chief Complaint  Patient presents with   Finger Injury    HPI Tom Miranda is a 48 y.o. male.   Tom Miranda is a 48 y.o. male presenting for chief complaint of Finger Injury that happened 4 days ago.  States he was horse playing when he injured his left little finger.  He is unable to remember the exact mechanism of injury but states he is now having pain and swelling to the middle phalanx of the left little finger.  Denies previous injury to the left little finger, numbness and tingling distally to injury, and temperature/color changes of the little finger.  States the finger was very swollen initially, swelling has improved significantly.  He is taking ibuprofen  for pain with some relief.  States pain has improved significantly since injury onset.     Past Medical History:  Diagnosis Date   Asthma    GERD (gastroesophageal reflux disease)    Hyperlipidemia    Post concussion syndrome     Patient Active Problem List   Diagnosis Date Noted   Back pain 11/04/2022   Orthostatic lightheadedness 08/16/2022   Concussion with no loss of consciousness 10/20/2020   Post-concussion headache 10/20/2020   Rash and nonspecific skin eruption 07/25/2020   Prediabetes 02/16/2020   Hyperlipidemia 02/16/2020   GERD (gastroesophageal reflux disease)    Chest pain 08/18/2019   Obesity (BMI 30.0-34.9) 06/02/2019   Glaucoma 06/02/2019    Past Surgical History:  Procedure Laterality Date   No prior surgery         Home Medications    Prior to Admission medications   Not on File    Family History Family History  Problem Relation Age of Onset   Glaucoma Mother     Social History Social History   Tobacco Use   Smoking status: Former    Types: Cigarettes    Passive exposure: Past   Smokeless tobacco: Never  Vaping Use   Vaping status: Never Used   Substance Use Topics   Alcohol use: Yes    Comment: Occasional   Drug use: No     Allergies   Patient has no known allergies.   Review of Systems Review of Systems Per HPI  Physical Exam Triage Vital Signs ED Triage Vitals [10/27/23 1102]  Encounter Vitals Group     BP 114/68     Girls Systolic BP Percentile      Girls Diastolic BP Percentile      Boys Systolic BP Percentile      Boys Diastolic BP Percentile      Pulse Rate (!) 52     Resp 18     Temp 98.8 F (37.1 C)     Temp Source Oral     SpO2 98 %     Weight      Height      Head Circumference      Peak Flow      Pain Score 8     Pain Loc      Pain Education      Exclude from Growth Chart    No data found.  Updated Vital Signs BP 114/68 (BP Location: Right Arm)   Pulse (!) 52   Temp 98.8 F (37.1 C) (Oral)   Resp 18   SpO2 98%   Visual Acuity Right Eye Distance:  Left Eye Distance:   Bilateral Distance:    Right Eye Near:   Left Eye Near:    Bilateral Near:     Physical Exam Vitals and nursing note reviewed.  Constitutional:      Appearance: He is not ill-appearing or toxic-appearing.  HENT:     Head: Normocephalic and atraumatic.     Right Ear: Hearing and external ear normal.     Left Ear: Hearing and external ear normal.     Nose: Nose normal.     Mouth/Throat:     Lips: Pink.  Eyes:     General: Lids are normal. Vision grossly intact. Gaze aligned appropriately.     Extraocular Movements: Extraocular movements intact.     Conjunctiva/sclera: Conjunctivae normal.  Pulmonary:     Effort: Pulmonary effort is normal.  Musculoskeletal:     Right hand: Normal.     Left hand: Swelling (Minimal swelling at the middle phalanx of the left pinky finger), tenderness and bony tenderness present. No deformity or lacerations. Decreased range of motion (Minimally decreased range of motion of the left pinky finger due to pain and swelling of the middle phalanx.). Normal strength. Normal  sensation. There is no disruption of two-point discrimination. Normal capillary refill. Normal pulse.     Cervical back: Neck supple.     Comments: Bony tenderness elicited to palpation over the left little finger middle phalanx.  Sensation intact distally.  Cap refill less than 2.  Strength intact to the left pinky finger.  +2 right radial pulse.  Skin:    General: Skin is warm and dry.     Capillary Refill: Capillary refill takes less than 2 seconds.     Findings: No rash.  Neurological:     General: No focal deficit present.     Mental Status: He is alert and oriented to person, place, and time. Mental status is at baseline.     Cranial Nerves: No dysarthria or facial asymmetry.  Psychiatric:        Mood and Affect: Mood normal.        Speech: Speech normal.        Behavior: Behavior normal.        Thought Content: Thought content normal.        Judgment: Judgment normal.      UC Treatments / Results  Labs (all labs ordered are listed, but only abnormal results are displayed) Labs Reviewed - No data to display  EKG   Radiology DG Finger Little Left Result Date: 10/27/2023 CLINICAL DATA:  Injury. EXAM: LEFT FINGER(S) - 2+ VIEW COMPARISON:  None Available. FINDINGS: Bone mineralization within normal limits for patient's age. There is comminuted mildly displaced fracture of the middle phalanx of left little finger with probable intra-articular or periarticular extension. No other acute fracture or dislocation. No aggressive osseous lesion. No significant arthritis of imaged joints. No radiopaque foreign bodies. No focal soft tissue swelling. IMPRESSION: Comminuted mildly displaced fracture of the middle phalanx of left little finger with probable intra-articular or periarticular extension. Electronically Signed   By: Ree Molt M.D.   On: 10/27/2023 11:24    Procedures Procedures (including critical care time)  Medications Ordered in UC Medications - No data to  display  Initial Impression / Assessment and Plan / UC Course  I have reviewed the triage vital signs and the nursing notes.  Pertinent labs & imaging results that were available during my care of the patient were reviewed by me and  considered in my medical decision making (see chart for details).   1.  Closed displaced fracture of the middle phalanx of left little finger Imaging shows comminuted mildly displaced fracture of the middle phalanx of left finger.  Left pinky finger splint applied.  Neurovascularly intact distally to injury. Will treat with supportive care including RICE, ibuprofen  as needed for pain and swelling, and follow-up with hand specialist as needed.  Will likely heal well on its own. Advised to wear the finger splint for the next 10 to 14 days to prevent reinjury/provide bony alignment.  Counseled patient on potential for adverse effects with medications prescribed/recommended today, strict ER and return-to-clinic precautions discussed, patient verbalized understanding.    Final Clinical Impressions(s) / UC Diagnoses   Final diagnoses:  Closed displaced fracture of middle phalanx of left little finger, initial encounter     Discharge Instructions      You broke your little finger.  Please wear the finger splint we have applied in the clinic today for the next 10 to 14 days to protect the finger from reinjury and to keep the bone aligned.  Take ibuprofen  600 mg every 6 hours as needed for pain and swelling.  Apply ice as needed to reduce swelling.  Please schedule a follow-up appointment with Dr. Alyse at [facility] as needed for new or worsening symptoms (numbness and tingling to the tip of the affected finger, temperature changes of the affected finger, etc.)  If you develop any new or worsening symptoms or if your symptoms do not start to improve, please return here or follow-up with your primary care provider. If your symptoms are severe, please go to the  emergency room.      ED Prescriptions   None    PDMP not reviewed this encounter.   Enedelia Dorna HERO, [place] 10/27/23 1205

## 2023-11-03 ENCOUNTER — Ambulatory Visit (INDEPENDENT_AMBULATORY_CARE_PROVIDER_SITE_OTHER): Admitting: Student

## 2023-11-03 ENCOUNTER — Encounter: Payer: Self-pay | Admitting: Student

## 2023-11-03 VITALS — BP 120/65 | HR 69 | Ht 68.0 in | Wt 221.2 lb

## 2023-11-03 DIAGNOSIS — Z0000 Encounter for general adult medical examination without abnormal findings: Secondary | ICD-10-CM | POA: Diagnosis not present

## 2023-11-03 DIAGNOSIS — Z1211 Encounter for screening for malignant neoplasm of colon: Secondary | ICD-10-CM

## 2023-11-03 DIAGNOSIS — R7303 Prediabetes: Secondary | ICD-10-CM

## 2023-11-03 DIAGNOSIS — Z13220 Encounter for screening for lipoid disorders: Secondary | ICD-10-CM | POA: Diagnosis not present

## 2023-11-03 DIAGNOSIS — L309 Dermatitis, unspecified: Secondary | ICD-10-CM | POA: Diagnosis not present

## 2023-11-03 DIAGNOSIS — Z1159 Encounter for screening for other viral diseases: Secondary | ICD-10-CM | POA: Diagnosis not present

## 2023-11-03 DIAGNOSIS — Z113 Encounter for screening for infections with a predominantly sexual mode of transmission: Secondary | ICD-10-CM | POA: Diagnosis not present

## 2023-11-03 LAB — POCT GLYCOSYLATED HEMOGLOBIN (HGB A1C): HbA1c, POC (prediabetic range): 6.2 % (ref 5.7–6.4)

## 2023-11-03 MED ORDER — HYDROCORTISONE 2.5 % EX OINT: TOPICAL_OINTMENT | Freq: Two times a day (BID) | CUTANEOUS | 0 refills | Status: AC

## 2023-11-03 NOTE — Assessment & Plan Note (Addendum)
 Patient comes in with concern of eczema, no reports he has had for years, 20+ years.  Patient reports more recently in the last couple months his eczema is been flaring, on his chest, and in his underarms.  Patient reports it is itchy, and dry, with dark patches.  Patient with notable eczematous patches of dry hyperpigmented skin located on chest, and underarms.  No erythema, or drainage thus low concern for infection, but did consider intertrigo.  Will trial steroid cream.  Patient uses Shea butter and [employer] for moisturizer. - Hydrocortisone  0.2% to affected areas BID

## 2023-11-03 NOTE — Patient Instructions (Addendum)
 It was great to see you! Thank you for allowing me to participate in your care!  I recommend that you always bring your medications to each appointment as this makes it easy to ensure we are on the correct medications and helps us  not miss when refills are needed.  Our plans for today:  - Eczema  Use hydrocortisone  cream twice a day after cleaning area  If patches do not get better, or get worse, make follow up appointment.  - Check Up  Today we are testing you for: Diabetes, high cholesterol, hepatitis B, Syphilis, HIV, and Sexually Transmitted Infections  We are checking some labs today, I will call you if they are abnormal will send you a MyChart message or a letter if they are normal.  If you do not hear about your labs in the next 2 weeks please let us  know.  Take care and seek immediate care sooner if you develop any concerns.   Dr. Penne Rhein, MD [facility]

## 2023-11-03 NOTE — Progress Notes (Signed)
 SUBJECTIVE:   Chief compliant/HPI: annual examination  Tom Miranda is a 48 y.o. who presents today for an annual exam.   Eczema Has had for years 20 plus, notes it usually shows up in elbow crease, but now is on chest and armpits. Uses Shae butter and [employer] oil. Been flared for couple months, tried changing washes and deodorants.   History tabs reviewed and updated .   Review of systems form reviewed and notable for Eczema.    Sexual History Has been sexually active with two partners in the last few months, and not used protection. Would like STI testing, report's no symptoms of burning, pain, discharge, redness or swelling.   OBJECTIVE:   BP 120/65   Pulse 69   Ht 5' 8 (1.727 m)   Wt 221 lb 3.2 oz (100.3 kg)   SpO2 100%   BMI 33.63 kg/m   Physical Exam Constitutional:      General: He is not in acute distress.    Appearance: Normal appearance. He is not ill-appearing.  Cardiovascular:     Rate and Rhythm: Normal rate and regular rhythm.     Pulses: Normal pulses.     Heart sounds: Normal heart sounds. No murmur heard.    No friction rub. No gallop.  Pulmonary:     Effort: Pulmonary effort is normal. No respiratory distress.     Breath sounds: Normal breath sounds. No stridor. No wheezing, rhonchi or rales.  Abdominal:     General: There is no distension.     Palpations: Abdomen is soft. There is no mass.     Tenderness: There is no abdominal tenderness. There is no guarding or rebound.     Hernia: No hernia is present.  Skin:    Comments: Multiple patches of dry skin with hyperpigmentation and pruritic  Neurological:     Mental Status: He is alert.  Psychiatric:        Mood and Affect: Mood normal.        Behavior: Behavior normal.         ASSESSMENT/PLAN:   Assessment & Plan Eczema, unspecified type Patient comes in with concern of eczema, no reports he has had for years, 20+ years.  Patient reports more recently in the last  couple months his eczema is been flaring, on his chest, and in his underarms.  Patient reports it is itchy, and dry, with dark patches.  Patient with notable eczematous patches of dry hyperpigmented skin located on chest, and underarms.  No erythema, or drainage thus low concern for infection, but did consider intertrigo.  Will trial steroid cream.  Patient uses Shea butter and [employer] for moisturizer. - Hydrocortisone  0.2% to affected areas BID Annual physical exam Patient comes in for annual physical exam, with complaint of eczema.  Patient also requesting routine STI screening, reports he is asymptomatic at this time.  Patient reports he has been sexually active with 2 partners, has not used protection.  Patient also would like his cholesterol checked and also reports he was previously prediabetic, would like A1c checked.  Patient reports he been working on diet and exercise to help monitor these issues. - A1c, lipid panel, hep B, urine cytology, HIV, RPR   Annual Examination  See AVS for recommendations.  PHQ score Not completed but feels okay/ even mood  Blood pressure value is goal.   Considered the following screening exams based upon USPSTF recommendations: Diabetes screening: discussed Screening for  elevated cholesterol: discussed and ordered HIV testing: discussed and ordered Hepatitis C: discussed Hepatitis B: discussed and ordered Syphilis if at high risk: discussed and ordered Sexually active with 2 partners, no protection Reviewed risk factors for latent tuberculosis and not indicated Colorectal cancer screening: discussed, colonoscopy ordered if age 79 or over or risk factors.     Follow up in 1  year or sooner if indicated.  MyChart Activation: Already signed up   Penne Rhein, MD [facility]

## 2023-11-03 NOTE — Assessment & Plan Note (Addendum)
 Patient comes in for annual physical exam, with complaint of eczema.  Patient also requesting routine STI screening, reports he is asymptomatic at this time.  Patient reports he has been sexually active with 2 partners, has not used protection.  Patient also would like his cholesterol checked and also reports he was previously prediabetic, would like A1c checked.  Patient reports he been working on diet and exercise to help monitor these issues. - A1c, lipid panel, hep B, urine cytology, HIV, RPR

## 2023-11-04 LAB — HEPATITIS B SURFACE ANTIGEN: Hepatitis B Surface Ag: NEGATIVE

## 2023-11-04 LAB — LIPID PANEL
Chol/HDL Ratio: 5.6 ratio — ABNORMAL HIGH (ref 0.0–5.0)
Cholesterol, Total: 217 mg/dL — ABNORMAL HIGH (ref 100–199)
HDL: 39 mg/dL — ABNORMAL LOW (ref >39)
LDL Chol Calc (NIH): 142 mg/dL — ABNORMAL HIGH (ref 0–99)
Triglycerides: 201 mg/dL — ABNORMAL HIGH (ref 0–149)
VLDL Cholesterol Cal: 36 mg/dL (ref 5–40)

## 2023-11-04 LAB — RPR: RPR Ser Ql: NONREACTIVE

## 2023-11-04 LAB — HIV ANTIBODY (ROUTINE TESTING W REFLEX): HIV Screen 4th Generation wRfx: NONREACTIVE

## 2023-11-05 ENCOUNTER — Ambulatory Visit: Payer: Self-pay | Admitting: Student

## 2023-11-07 LAB — URINE CYTOLOGY ANCILLARY ONLY
Chlamydia: NEGATIVE
Comment: NEGATIVE
Comment: NEGATIVE
Comment: NORMAL
Neisseria Gonorrhea: NEGATIVE
Trichomonas: NEGATIVE

## 2023-11-22 ENCOUNTER — Ambulatory Visit (HOSPITAL_COMMUNITY)
Admission: EM | Admit: 2023-11-22 | Discharge: 2023-11-22 | Disposition: A | Discharge status: Home / Self Care | Attending: Internal Medicine | Admitting: Internal Medicine

## 2023-11-22 ENCOUNTER — Ambulatory Visit (INDEPENDENT_AMBULATORY_CARE_PROVIDER_SITE_OTHER)

## 2023-11-22 ENCOUNTER — Encounter (HOSPITAL_COMMUNITY): Payer: Self-pay

## 2023-11-22 DIAGNOSIS — M79645 Pain in left finger(s): Secondary | ICD-10-CM

## 2023-11-22 DIAGNOSIS — S62627G Displaced fracture of medial phalanx of left little finger, subsequent encounter for fracture with delayed healing: Secondary | ICD-10-CM

## 2023-11-22 DIAGNOSIS — S62627D Displaced fracture of medial phalanx of left little finger, subsequent encounter for fracture with routine healing: Secondary | ICD-10-CM | POA: Diagnosis not present

## 2023-11-22 NOTE — ED Triage Notes (Signed)
 Patient had a fracture to the left pinky and the patient states he was told to come back for a follow up in 2 weeks.  Patient reports that he has some improvement in movement,but still sore.

## 2023-11-22 NOTE — ED Provider Notes (Signed)
 [facility]    CSN: [id number] Arrival date & time: 11/22/23  1633      History   Chief Complaint No chief complaint on file.   HPI Tom Miranda is a 48 y.o. male.   48 year old male who presents urgent care for recheck of his left fifth finger fracture.  The patient was seen on July 14 and diagnosed with a comminuted fracture.  He was advised to wear a splint for 14 days and then have a recheck.  He reports that the actual injury occurred on July 10.  He reports that the finger is much better but still has some tenderness.  He is able to make a fist.  He is no longer wearing the finger splint.     Past Medical History:  Diagnosis Date   Asthma    GERD (gastroesophageal reflux disease)    Hyperlipidemia    Post concussion syndrome     Patient Active Problem List   Diagnosis Date Noted   Eczema 11/03/2023   Annual physical exam 11/03/2023   Back pain 11/04/2022   Orthostatic lightheadedness 08/16/2022   Concussion with no loss of consciousness 10/20/2020   Post-concussion headache 10/20/2020   Rash and nonspecific skin eruption 07/25/2020   Prediabetes 02/16/2020   Hyperlipidemia 02/16/2020   GERD (gastroesophageal reflux disease)    Chest pain 08/18/2019   Obesity (BMI 30.0-34.9) 06/02/2019   Glaucoma 06/02/2019    Past Surgical History:  Procedure Laterality Date   No prior surgery         Home Medications    Prior to Admission medications   Medication Sig Start Date End Date Taking? Authorizing Provider  hydrocortisone  2.5 % ointment Apply topically 2 (two) times daily. 11/03/23   Jennelle Riis, MD    Family History Family History  Problem Relation Age of Onset   Glaucoma Mother     Social History Social History   Tobacco Use   Smoking status: Former    Types: Cigarettes    Passive exposure: Past   Smokeless tobacco: Never  Vaping Use   Vaping status: Never Used  Substance Use Topics   Alcohol use: Yes    Comment:  Occasional   Drug use: No     Allergies   Patient has no known allergies.   Review of Systems Review of Systems  Constitutional:  Negative for chills and fever.  HENT:  Negative for ear pain and sore throat.   Eyes:  Negative for pain and visual disturbance.  Respiratory:  Negative for cough and shortness of breath.   Cardiovascular:  Negative for chest pain and palpitations.  Gastrointestinal:  Negative for abdominal pain and vomiting.  Genitourinary:  Negative for dysuria and hematuria.  Musculoskeletal:  Negative for arthralgias and back pain.       Left 5th finger discomfort  Skin:  Negative for color change and rash.  Neurological:  Negative for seizures and syncope.  All other systems reviewed and are negative.    Physical Exam Triage Vital Signs ED Triage Vitals [11/22/23 1658]  Encounter Vitals Group     BP 108/69     Girls Systolic BP Percentile      Girls Diastolic BP Percentile      Boys Systolic BP Percentile      Boys Diastolic BP Percentile      Pulse Rate 78     Resp 14     Temp 98.4 F (36.9 C)     Temp Source Oral  SpO2 95 %     Weight      Height      Head Circumference      Peak Flow      Pain Score 4     Pain Loc      Pain Education      Exclude from Growth Chart    No data found.  Updated Vital Signs BP 108/69 (BP Location: Left Arm)   Pulse 78   Temp 98.4 F (36.9 C) (Oral)   Resp 14   SpO2 95%   Visual Acuity Right Eye Distance:   Left Eye Distance:   Bilateral Distance:    Right Eye Near:   Left Eye Near:    Bilateral Near:     Physical Exam Vitals and nursing note reviewed.  Constitutional:      General: He is not in acute distress.    Appearance: He is well-developed.  HENT:     Head: Normocephalic and atraumatic.  Eyes:     Conjunctiva/sclera: Conjunctivae normal.  Cardiovascular:     Rate and Rhythm: Normal rate and regular rhythm.     Heart sounds: No murmur heard. Pulmonary:     Effort: Pulmonary effort  is normal. No respiratory distress.     Breath sounds: Normal breath sounds.  Abdominal:     Palpations: Abdomen is soft.     Tenderness: There is no abdominal tenderness.  Musculoskeletal:        General: No swelling.       Hands:     Cervical back: Neck supple.  Skin:    General: Skin is warm and dry.     Capillary Refill: Capillary refill takes less than 2 seconds.  Neurological:     Mental Status: He is alert.  Psychiatric:        Mood and Affect: Mood normal.      UC Treatments / Results  Labs (all labs ordered are listed, but only abnormal results are displayed) Labs Reviewed - No data to display  EKG   Radiology DG Finger Little Left Result Date: 11/22/2023 CLINICAL DATA:  fracture recheck EXAM: LEFT FINGER(S) - 2+ VIEW COMPARISON:  X-ray left finger 10/27/2023 FINDINGS: Mild periosteal reaction of a minimally displaced, intra-articular (at the base) fifth digit middle phalangeal fracture. There is no evidence of acute fracture or dislocation. There is no evidence of arthropathy or other focal bone abnormality. Soft tissues are unremarkable. IMPRESSION: Mild periosteal reaction of a minimally displaced, intra-articular (at the base) fifth digit middle phalangeal fracture. Electronically Signed   By: Morgane  Naveau M.D.   On: 11/22/2023 17:25    Procedures Procedures (including critical care time)  Medications Ordered in UC Medications - No data to display  Initial Impression / Assessment and Plan / UC Course  I have reviewed the triage vital signs and the nursing notes.  Pertinent labs & imaging results that were available during my care of the patient were reviewed by me and considered in my medical decision making (see chart for details).     Finger pain, left - Plan: DG Finger Little Left, DG Finger Little Left  Closed displaced fracture of middle phalanx of left little finger with delayed healing, subsequent encounter   Repeat x-ray done of the left 5th  finger.  The final evaluation showed some periosteal elevation and given this we recommend following up with the hand surgeons to ensure no further intervention is needed.  Call them on Monday to schedule an appointment  for follow-up.  We have attached their information.  Otherwise continue to use caution with the finger and if severe pain is present discontinue activity.  Final Clinical Impressions(s) / UC Diagnoses   Final diagnoses:  Finger pain, left  Closed displaced fracture of middle phalanx of left little finger with delayed healing, subsequent encounter     Discharge Instructions      Repeat x-ray done of the left 5th finger.  The final evaluation showed some periosteal elevation and given this we recommend following up with the hand surgeons to ensure no further intervention is needed.  Call them on Monday to schedule an appointment for follow-up.  We have attached their information.  Otherwise continue to use caution with the finger and if severe pain is present discontinue activity.    ED Prescriptions   None    PDMP not reviewed this encounter.   Teresa Almarie LABOR, [place] 11/22/23 1729

## 2023-11-22 NOTE — Discharge Instructions (Addendum)
 Repeat x-ray done of the left 5th finger.  The final evaluation showed some periosteal elevation and given this we recommend following up with the hand surgeons to ensure no further intervention is needed.  Call them on Monday to schedule an appointment for follow-up.  We have attached their information.  Otherwise continue to use caution with the finger and if severe pain is present discontinue activity.

## 2023-12-09 DIAGNOSIS — M79645 Pain in left finger(s): Secondary | ICD-10-CM | POA: Diagnosis not present

## 2023-12-09 DIAGNOSIS — S62627A Displaced fracture of medial phalanx of left little finger, initial encounter for closed fracture: Secondary | ICD-10-CM | POA: Diagnosis not present

## 2023-12-28 ENCOUNTER — Encounter (HOSPITAL_COMMUNITY): Payer: Self-pay

## 2023-12-28 ENCOUNTER — Ambulatory Visit (HOSPITAL_COMMUNITY)
Admission: EM | Admit: 2023-12-28 | Discharge: 2023-12-28 | Disposition: A | Discharge status: Home / Self Care | Attending: Emergency Medicine | Admitting: Emergency Medicine

## 2023-12-28 DIAGNOSIS — R197 Diarrhea, unspecified: Secondary | ICD-10-CM

## 2023-12-28 DIAGNOSIS — K529 Noninfective gastroenteritis and colitis, unspecified: Secondary | ICD-10-CM

## 2023-12-28 MED ORDER — ONDANSETRON 8 MG PO TBDP: 8.0000 mg | ORAL_TABLET | Freq: Three times a day (TID) | ORAL | 0 refills | Status: AC | PRN

## 2023-12-28 MED ORDER — LOPERAMIDE HCL 2 MG PO CAPS: 2.0000 mg | ORAL_CAPSULE | Freq: Four times a day (QID) | ORAL | 0 refills | Status: AC | PRN

## 2023-12-28 NOTE — Discharge Instructions (Addendum)
 Use the nausea medicine every 8 hours as needed.  Starting tomorrow if you continue to have diarrhea you can use the Imodium  as needed.  Ensure you are drinking at least 64 ounces of water daily.  Follow a bland diet, dietary guidelines attached, to help with irritation and inflammation of the GI tract.  Symptoms should improve over the next few days, if no improvement or any changes follow-up with your primary care provider or return to clinic for reevaluation.

## 2023-12-28 NOTE — ED Provider Notes (Signed)
 [facility]    CSN: [id number] Arrival date & time: 12/28/23  1547      History   Chief Complaint Chief Complaint  Patient presents with   Abdominal Pain    HPI Tom Miranda is a 48 y.o. male.   Patient presents to clinic over concerns of generalized abdominal pain, diarrhea and nausea that started abruptly around 4 5 AM this morning.  He has been running back-and-forth to the toilet with watery stools.  Tried to take an over-the-counter Pepto chew which did not seem to help.  He has had heartburn and nausea, no emesis.  Grandson with similar symptoms.  Has not had any fevers.  Drinking ginger ale in clinic.  The history is provided by the patient and medical records.  Abdominal Pain   Past Medical History:  Diagnosis Date   Asthma    GERD (gastroesophageal reflux disease)    Hyperlipidemia    Post concussion syndrome     Patient Active Problem List   Diagnosis Date Noted   Eczema 11/03/2023   Annual physical exam 11/03/2023   Back pain 11/04/2022   Orthostatic lightheadedness 08/16/2022   Concussion with no loss of consciousness 10/20/2020   Post-concussion headache 10/20/2020   Rash and nonspecific skin eruption 07/25/2020   Prediabetes 02/16/2020   Hyperlipidemia 02/16/2020   GERD (gastroesophageal reflux disease)    Chest pain 08/18/2019   Obesity (BMI 30.0-34.9) 06/02/2019   Glaucoma 06/02/2019    Past Surgical History:  Procedure Laterality Date   No prior surgery         Home Medications    Prior to Admission medications   Medication Sig Start Date End Date Taking? Authorizing Provider  loperamide  (IMODIUM ) 2 MG capsule Take 1 capsule (2 mg total) by mouth 4 (four) times daily as needed for diarrhea or loose stools. 12/28/23  Yes Jamarquis Crull  N, FNP  ondansetron  (ZOFRAN -ODT) 8 MG disintegrating tablet Take 1 tablet (8 mg total) by mouth every 8 (eight) hours as needed. 12/28/23  Yes Sunnie Odden  N, FNP  hydrocortisone  2.5 %  ointment Apply topically 2 (two) times daily. 11/03/23   Jennelle Riis, MD    Family History Family History  Problem Relation Age of Onset   Glaucoma Mother     Social History Social History   Tobacco Use   Smoking status: Former    Types: Cigarettes    Passive exposure: Past   Smokeless tobacco: Never  Vaping Use   Vaping status: Never Used  Substance Use Topics   Alcohol use: Yes    Comment: Occasional   Drug use: No     Allergies   Patient has no known allergies.   Review of Systems Review of Systems  Per HPI  Physical Exam Triage Vital Signs ED Triage Vitals  Encounter Vitals Group     BP 12/28/23 1718 130/65     Girls Systolic BP Percentile --      Girls Diastolic BP Percentile --      Boys Systolic BP Percentile --      Boys Diastolic BP Percentile --      Pulse Rate 12/28/23 1718 76     Resp 12/28/23 1718 16     Temp 12/28/23 1718 98.3 F (36.8 C)     Temp Source 12/28/23 1718 Oral     SpO2 12/28/23 1718 99 %     Weight --      Height --      Head Circumference --  Peak Flow --      Pain Score 12/28/23 1720 6     Pain Loc --      Pain Education --      Exclude from Growth Chart --    No data found.  Updated Vital Signs BP 130/65 (BP Location: Right Arm)   Pulse 76   Temp 98.3 F (36.8 C) (Oral)   Resp 16   SpO2 99%   Visual Acuity Right Eye Distance:   Left Eye Distance:   Bilateral Distance:    Right Eye Near:   Left Eye Near:    Bilateral Near:     Physical Exam Vitals and nursing note reviewed.  Constitutional:      Appearance: He is well-developed.  HENT:     Head: Normocephalic and atraumatic.     Mouth/Throat:     Mouth: Mucous membranes are moist.  Cardiovascular:     Rate and Rhythm: Normal rate.  Pulmonary:     Effort: Pulmonary effort is normal. No respiratory distress.  Abdominal:     General: Abdomen is flat. Bowel sounds are increased.     Palpations: Abdomen is soft.     Tenderness: There is  abdominal tenderness in the epigastric area. There is no guarding or rebound.  Skin:    General: Skin is warm and dry.  Neurological:     General: No focal deficit present.     Mental Status: He is alert and oriented to person, place, and time.  Psychiatric:        Mood and Affect: Mood normal.        Behavior: Behavior normal.      UC Treatments / Results  Labs (all labs ordered are listed, but only abnormal results are displayed) Labs Reviewed - No data to display  EKG   Radiology No results found.  Procedures Procedures (including critical care time)  Medications Ordered in UC Medications - No data to display  Initial Impression / Assessment and Plan / UC Course  I have reviewed the triage vital signs and the nursing notes.  Pertinent labs & imaging results that were available during my care of the patient were reviewed by me and considered in my medical decision making (see chart for details).  Vitals and triage reviewed, patient is hemodynamically stable.  Abdomen is mildly tender to the epigastric area, without rebound or guarding, hyperactive bowel sounds.  Low concern for acute abdomen at this time.  Suspect gastroenteritis, most likely viral.  Symptomatic management discussed.  Plan of care, follow-up care return precautions given, no questions at this time.  Work note provided.     Final Clinical Impressions(s) / UC Diagnoses   Final diagnoses:  Diarrhea, unspecified type  Gastroenteritis     Discharge Instructions      Use the nausea medicine every 8 hours as needed.  Starting tomorrow if you continue to have diarrhea you can use the Imodium  as needed.  Ensure you are drinking at least 64 ounces of water daily.  Follow a bland diet, dietary guidelines attached, to help with irritation and inflammation of the GI tract.  Symptoms should improve over the next few days, if no improvement or any changes follow-up with your primary care provider or return to  clinic for reevaluation.     ED Prescriptions     Medication Sig Dispense Auth. Provider   ondansetron  (ZOFRAN -ODT) 8 MG disintegrating tablet Take 1 tablet (8 mg total) by mouth every 8 (eight) hours as  needed. 20 tablet Dreama, Jaylenn Altier  N, FNP   loperamide  (IMODIUM ) 2 MG capsule Take 1 capsule (2 mg total) by mouth 4 (four) times daily as needed for diarrhea or loose stools. 12 capsule Dreama, Nyeshia Mysliwiec  N, FNP      PDMP not reviewed this encounter.   Dreama, Shivaun Bilello  N, FNP 12/28/23 1744

## 2023-12-28 NOTE — ED Triage Notes (Signed)
 Patient here today with c/o abd pain, slight diarrhea, and nausea since this morning at 4-5 am. Patient has tried taking something OTC for upset stomach, heartburn, and diarrhea with no relief. His grandson has been feeling the same way a couple days ago.

## 2024-01-21 DIAGNOSIS — S62627A Displaced fracture of medial phalanx of left little finger, initial encounter for closed fracture: Secondary | ICD-10-CM | POA: Diagnosis not present

## 2024-01-21 DIAGNOSIS — M79645 Pain in left finger(s): Secondary | ICD-10-CM | POA: Diagnosis not present

## 2024-02-05 ENCOUNTER — Encounter: Payer: Self-pay | Admitting: Family Medicine

## 2024-02-16 ENCOUNTER — Encounter: Payer: Self-pay | Admitting: Family Medicine

## 2024-02-16 ENCOUNTER — Ambulatory Visit: Admitting: Family Medicine

## 2024-02-16 VITALS — BP 102/52 | HR 82 | Temp 98.8°F | Ht 68.0 in | Wt 224.2 lb

## 2024-02-16 DIAGNOSIS — J069 Acute upper respiratory infection, unspecified: Secondary | ICD-10-CM

## 2024-02-16 NOTE — Progress Notes (Signed)
    SUBJECTIVE:   CHIEF COMPLAINT / HPI:   Not feeling well  Discussed the use of AI scribe software for clinical note transcription with the patient, who gave verbal consent to proceed.  History of Present Illness Tom Miranda is a 48 year old male who presents with congestion, headaches, and diarrhea.  Upper respiratory symptoms - Congestion and headache with sensation of pressure since yesterday afternoon - Alka-Seltzer Sinus and Cold tablets provide slight relief - No fever or vomiting  Endorses occasional diarrhea. Using pepto bismol. No significant abd pain or vomiting, no blood in stool  No hx asthma. Stopped smoking a while ago     PERTINENT  PMH / PSH: prediabetes, HLD, obesity  OBJECTIVE:   BP (!) 102/52   Pulse 82   Temp 98.8 F (37.1 C) (Oral)   Ht 5' 8 (1.727 m)   Wt 224 lb 3.2 oz (101.7 kg)   SpO2 99%   BMI 34.09 kg/m    General: NAD, pleasant, able to participate in exam HEENT: posterior oropharynx slightly erythematous but airway patent and no significant tonsillar swelling or exudate Cardiac: RRR, no murmurs auscultated Respiratory: CTAB, normal WOB Abdomen: soft, non-tender, non-distended, normoactive bowel sounds Extremities: warm and well perfused, no edema or cyanosis Skin: warm and dry, no rashes noted Neuro: alert, no obvious focal deficits, speech normal Psych: Normal affect and mood  ASSESSMENT/PLAN:    Assessment & Plan Viral URI with cough Suspect viral URI No red flags in history or exam today. Afebrile and hemodynamically stable, well appearing. COVID/flu swab declined Discussed supportive care and return precautions Recommend hydration, air humidifiers, honey water, lozenges.  Discussed OTC cough medication can be hit or miss and are not routinely recommended   Payton Coward, MD [facility]

## 2024-02-16 NOTE — Patient Instructions (Signed)
 Try Allegra-D for sinus congestion - don't take this at night  Vicks sugar free menthol cough drops or cepacol lozenges may work better than regular cough drops

## 2024-03-09 ENCOUNTER — Encounter: Payer: Self-pay | Admitting: Student

## 2024-03-09 ENCOUNTER — Ambulatory Visit: Admitting: Student

## 2024-03-09 VITALS — BP 126/68 | HR 86 | Ht 68.0 in | Wt 222.0 lb

## 2024-03-09 DIAGNOSIS — R7303 Prediabetes: Secondary | ICD-10-CM | POA: Diagnosis not present

## 2024-03-09 DIAGNOSIS — I9589 Other hypotension: Secondary | ICD-10-CM | POA: Diagnosis not present

## 2024-03-09 NOTE — Patient Instructions (Signed)
 It was great to see you today!   Come back to have your A1c rechecked when you are ready   No future appointments.  Please arrive 15 minutes before your appointment to ensure smooth check in process.  If you are more than 15 minutes late, you may be asked to reschedule.   Please bring a list of your medications with you to all appointments.   Please call the clinic at [phone] if your symptoms worsen or you have any concerns.  Thank you for allowing me to participate in your care, Dr. Damien Pinal [facility]

## 2024-03-09 NOTE — Progress Notes (Unsigned)
    SUBJECTIVE:   CHIEF COMPLAINT / HPI:   Tom Miranda is a 48 y.o. male presenting for blood pressure recheck.   Patient was seen on 02/16/24 for viral URI which has since resolved. His blood pressure was noted to be 102/52. He was asymptomatic at the time.   He denies LOC, dizziness at home.   PERTINENT  PMH / PSH: reviewed and updated.  OBJECTIVE:   BP 126/68   Pulse 86   Ht 5' 8 (1.727 m)   Wt 222 lb (100.7 kg)   SpO2 99%   BMI 33.75 kg/m   Well-appearing, no acute distress Cardio: Regular rate, regular rhythm, no murmurs on exam. Pulm: Clear, no wheezing, no crackles. No increased work of breathing Abdominal: bowel sounds present, soft, non-tender, non-distended Extremities: no peripheral edema    ASSESSMENT/PLAN:   Assessment & Plan Other specified hypotension Resolved on recheck. Asymptomatic on exam.  Return as needed for recheck  Prediabetes A1c noted to be in prediabetic range.  Due for recheck, patient declined at this time and would like to have recheck at a later date      Damien Pinal, DO [facility]

## 2024-03-10 ENCOUNTER — Encounter: Payer: Self-pay | Admitting: Family Medicine

## 2024-03-10 ENCOUNTER — Ambulatory Visit: Admitting: Family Medicine

## 2024-03-10 VITALS — BP 104/59 | HR 83 | Ht 68.0 in | Wt 220.4 lb

## 2024-03-10 DIAGNOSIS — J029 Acute pharyngitis, unspecified: Secondary | ICD-10-CM | POA: Diagnosis not present

## 2024-03-10 DIAGNOSIS — Z113 Encounter for screening for infections with a predominantly sexual mode of transmission: Secondary | ICD-10-CM

## 2024-03-10 NOTE — Assessment & Plan Note (Addendum)
 A1c noted to be in prediabetic range.  Due for recheck, patient declined at this time and would like to have recheck at a later date

## 2024-03-10 NOTE — Progress Notes (Signed)
   SUBJECTIVE:   CHIEF COMPLAINT / HPI:  Discussed the use of AI scribe software for clinical note transcription with the patient, who gave verbal consent to proceed.  History of Present Illness Tom Miranda is a 48 year old male who presents with a sore throat and requests STI screening.  Pharyngitis symptoms - Sore throat with pain on swallowing since yesterday - No cough, body aches, or fever - History of strep throat previously   Sexually transmitted infection screening - Requests STI screening, including throat swab - No symptoms of sexually transmitted infections - Two sexual partners in the last six months - No history of sexually transmitted infections in the throat    PERTINENT  PMH / PSH: Prediabetes   OBJECTIVE:  BP (!) 104/59   Pulse 83   Ht 5' 8 (1.727 m)   Wt 220 lb 6.4 oz (100 kg)   SpO2 96%   BMI 33.51 kg/m   General: well appearing, in no acute distress HEENT:  white exudates along oropharynx mainly on left side., shoddy cervical lymphadenopathy  Resp: Normal work of breathing on room air  ASSESSMENT/PLAN:   Assessment & Plan Sore throat Ddx includes strep, STI, viral etiology.  - strep test, throat GC/CT  - Recommended tylenol  and ibuprofen  for pain as well as honey for discomfort  Screening examination for STD (sexually transmitted disease) Throat GC,CT, urine GC/CT, trich, Serum HIV RPR    Areta Saliva, MD [facility]

## 2024-03-10 NOTE — Patient Instructions (Addendum)
 It was wonderful to see you today.  Please bring ALL of your medications with you to every visit.   Today we talked about:  Sore throat - I believe this is most likely a viral upper respiratory infection. For throat pain you can use tylenol  and ibuprofen  alternating as needed. You can also use honey for discomfort. I will let you know the results of the strep and STI swab as soon as I see them.   For your STI testing you will get the results via mychart.   Thank you for choosing [facility].   Please call [phone] with any questions about today's appointment.  Areta Saliva, MD  Family Medicine

## 2024-03-11 LAB — SYPHILIS: RPR W/REFLEX TO RPR TITER AND TREPONEMAL ANTIBODIES, TRADITIONAL SCREENING AND DIAGNOSIS ALGORITHM: RPR Ser Ql: NONREACTIVE

## 2024-03-11 LAB — HIV ANTIBODY (ROUTINE TESTING W REFLEX): HIV Screen 4th Generation wRfx: NONREACTIVE

## 2024-03-15 ENCOUNTER — Encounter: Payer: Self-pay | Admitting: Family Medicine

## 2024-03-15 NOTE — Telephone Encounter (Signed)
 Called patient at 4:21 pm. Apologized for inconvenience of having to come back into office for strep test. Unfortunately, I was unable to view message and schedule accordingly prior to patient being told that he could not be seen.   Offered to still swab patient today prior to clinic closure. Patient reports that he is already on the other side of [place] and would not be able to come back. He elects to keep appt for tomorrow afternoon.   Chiquita JAYSON English, RN

## 2024-03-16 ENCOUNTER — Ambulatory Visit: Admitting: Family Medicine

## 2024-03-16 LAB — URINE CYTOLOGY ANCILLARY ONLY
Chlamydia: NEGATIVE
Comment: NEGATIVE
Comment: NEGATIVE
Comment: NORMAL
Neisseria Gonorrhea: NEGATIVE
Trichomonas: NEGATIVE

## 2024-03-16 LAB — CERVICOVAGINAL ANCILLARY ONLY
Chlamydia: NEGATIVE
Comment: NEGATIVE
Comment: NORMAL
Neisseria Gonorrhea: NEGATIVE

## 2024-03-16 NOTE — Progress Notes (Deleted)
    SUBJECTIVE:   CHIEF COMPLAINT / HPI:   ***  EP is a 48yo M that pf sore throat.      Rapid strep  PERTINENT  PMH / PSH: ***  OBJECTIVE:   There were no vitals taken for this visit.  ***  ASSESSMENT/PLAN:   Assessment & Plan      Twyla Nearing, MD [facility]

## 2024-03-18 ENCOUNTER — Ambulatory Visit: Payer: Self-pay | Admitting: Family Medicine

## 2024-04-21 ENCOUNTER — Ambulatory Visit (HOSPITAL_COMMUNITY)
Admission: EM | Admit: 2024-04-21 | Discharge: 2024-04-21 | Disposition: A | Discharge status: Home / Self Care | Attending: Family Medicine | Admitting: Family Medicine

## 2024-04-21 ENCOUNTER — Encounter (HOSPITAL_COMMUNITY): Payer: Self-pay

## 2024-04-21 DIAGNOSIS — J111 Influenza due to unidentified influenza virus with other respiratory manifestations: Secondary | ICD-10-CM

## 2024-04-21 DIAGNOSIS — R6889 Other general symptoms and signs: Secondary | ICD-10-CM | POA: Diagnosis not present

## 2024-04-21 LAB — POCT INFLUENZA A/B
Influenza A, POC: NEGATIVE
Influenza B, POC: NEGATIVE

## 2024-04-21 MED ORDER — HYDROCODONE BIT-HOMATROP MBR 5-1.5 MG/5ML PO SOLN: 5.0000 mL | Freq: Four times a day (QID) | ORAL | 0 refills | Status: AC | PRN

## 2024-04-21 NOTE — ED Triage Notes (Signed)
 Patient presents to the office for cough,headache and sore throat x 2 days.

## 2024-04-24 NOTE — ED Provider Notes (Signed)
 " [facility]   [id number] 04/21/24 Arrival Time: 1932  ASSESSMENT & PLAN:  1. Not feeling great   2. Influenza-like illness    Discussed typical duration of likely viral illness. Results for orders placed or performed during the hospital encounter of 04/21/24  POC Influenza A/B   Collection Time: 04/21/24  8:20 PM  Result Value Ref Range   Influenza A, POC Negative Negative   Influenza B, POC Negative Negative   OTC symptom care as needed.  Meds ordered this encounter  Medications   HYDROcodone  bit-homatropine (HYCODAN) 5-1.5 MG/5ML syrup    Sig: Take 5 mLs by mouth every 6 (six) hours as needed for cough.    Dispense:  90 mL    Refill:  0     Follow-up Information      [facility].   Specialty: Urgent Care Why: As needed. Contact information: [address] [place]  [address] [phone]                Reviewed expectations re: course of current medical issues. Questions answered. Outlined signs and symptoms indicating need for more acute intervention. Understanding verbalized. After Visit Summary given.   SUBJECTIVE: History from: Patient. Tom Miranda is a 49 y.o. male. Patient presents to the office for cough,headache and sore throat x 2 days.  Denies: fever and difficulty breathing. Normal PO intake without n/v/d.  OBJECTIVE:  Vitals:   04/21/24 1956  BP: 121/79  Pulse: (!) 109  Resp: 18  Temp: 99.6 F (37.6 C)  TempSrc: Oral  SpO2: 98%    General appearance: alert; no distress Eyes: PERRLA; EOMI; conjunctiva normal HENT: [place]; AT; with nasal congestion Neck: supple  Lungs: speaks full sentences without difficulty; unlabored; cough present Extremities: no edema Skin: warm and dry Neurologic: normal gait Psychological: alert and cooperative; normal mood and affect  Labs: Results for orders placed or performed during the hospital encounter of 04/21/24  POC Influenza A/B    Collection Time: 04/21/24  8:20 PM  Result Value Ref Range   Influenza A, POC Negative Negative   Influenza B, POC Negative Negative   Labs Reviewed  POCT INFLUENZA A/B    Imaging: No results found.  Allergies[1]  Past Medical History:  Diagnosis Date   Asthma    GERD (gastroesophageal reflux disease)    Hyperlipidemia    Post concussion syndrome    Social History   Socioeconomic History   Marital status: Single    Spouse name: Not on file   Number of children: 7   Years of education: Not on file   Highest education level: Not on file  Occupational History   Not on file  Tobacco Use   Smoking status: Former    Types: Cigarettes    Passive exposure: Past   Smokeless tobacco: Never  Vaping Use   Vaping status: Never Used  Substance and Sexual Activity   Alcohol use: Yes    Comment: Occasional   Drug use: No   Sexual activity: Not on file  Other Topics Concern   Not on file  Social History Narrative   Right handed   Some caffeine    Social Drivers of Health   Tobacco Use: Medium Risk (04/21/2024)   Patient History    Smoking Tobacco Use: Former    Smokeless Tobacco Use: Never    Passive Exposure: Past  Physicist, Medical Strain: Not on [employer] Insecurity: Not on file  Transportation Needs:  Not on file  Physical Activity: Not on file  Stress: Not on file  Social Connections: Not on file  Intimate Partner Violence: Not on file  Depression [id number]): Low Risk (03/10/2024)   Depression (PHQ2-9)    PHQ-2 Score: 0  Alcohol Screen: Not on file  Housing: Not on file  Utilities: Not on file  Health Literacy: Not on file   Family History  Problem Relation Age of Onset   Glaucoma Mother    Past Surgical History:  Procedure Laterality Date   No prior surgery        [1] No Known Allergies    Rolinda Rogue, MD 04/24/24 9091  "
